# Patient Record
Sex: Female | Born: 1950 | Hispanic: Yes | Smoking: Never smoker
Health system: Southern US, Community
[De-identification: ages and names within clinical notes are randomized; demographics above are authoritative.]

## PROBLEM LIST (undated history)

## (undated) DIAGNOSIS — I10 Essential (primary) hypertension: Secondary | ICD-10-CM

## (undated) DIAGNOSIS — M797 Fibromyalgia: Secondary | ICD-10-CM

## (undated) DIAGNOSIS — E119 Type 2 diabetes mellitus without complications: Secondary | ICD-10-CM

## (undated) HISTORY — PX: OTHER SURGICAL HISTORY: SHX169

## (undated) HISTORY — PX: APPENDECTOMY: SHX54

## (undated) HISTORY — PX: BREAST REDUCTION SURGERY: SHX8

---

## 1997-09-21 DIAGNOSIS — E119 Type 2 diabetes mellitus without complications: Secondary | ICD-10-CM

## 2002-09-21 HISTORY — PX: BACK SURGERY: SHX140

## 2007-10-05 ENCOUNTER — Encounter: Payer: Self-pay | Admitting: Family Medicine

## 2008-03-19 ENCOUNTER — Ambulatory Visit: Payer: Self-pay | Admitting: Family Medicine

## 2008-03-19 DIAGNOSIS — Z862 Personal history of diseases of the blood and blood-forming organs and certain disorders involving the immune mechanism: Secondary | ICD-10-CM

## 2008-03-19 DIAGNOSIS — E669 Obesity, unspecified: Secondary | ICD-10-CM

## 2008-03-19 DIAGNOSIS — Z8639 Personal history of other endocrine, nutritional and metabolic disease: Secondary | ICD-10-CM

## 2008-03-19 DIAGNOSIS — R12 Heartburn: Secondary | ICD-10-CM

## 2008-03-19 DIAGNOSIS — I10 Essential (primary) hypertension: Secondary | ICD-10-CM

## 2008-04-17 ENCOUNTER — Telehealth: Payer: Self-pay | Admitting: Family Medicine

## 2008-04-24 ENCOUNTER — Encounter: Payer: Self-pay | Admitting: Family Medicine

## 2008-04-26 ENCOUNTER — Encounter: Payer: Self-pay | Admitting: Family Medicine

## 2008-09-10 ENCOUNTER — Encounter: Payer: Self-pay | Admitting: Family Medicine

## 2008-09-19 ENCOUNTER — Telehealth (INDEPENDENT_AMBULATORY_CARE_PROVIDER_SITE_OTHER): Payer: Self-pay | Admitting: *Deleted

## 2008-09-24 ENCOUNTER — Ambulatory Visit: Payer: Self-pay | Admitting: Family Medicine

## 2008-09-24 ENCOUNTER — Telehealth: Payer: Self-pay | Admitting: Family Medicine

## 2008-09-24 DIAGNOSIS — R519 Headache, unspecified: Secondary | ICD-10-CM | POA: Insufficient documentation

## 2008-09-24 DIAGNOSIS — M25569 Pain in unspecified knee: Secondary | ICD-10-CM

## 2008-09-24 DIAGNOSIS — R42 Dizziness and giddiness: Secondary | ICD-10-CM

## 2008-09-24 DIAGNOSIS — M25519 Pain in unspecified shoulder: Secondary | ICD-10-CM

## 2008-09-24 DIAGNOSIS — I7300 Raynaud's syndrome without gangrene: Secondary | ICD-10-CM

## 2008-09-24 DIAGNOSIS — K117 Disturbances of salivary secretion: Secondary | ICD-10-CM | POA: Insufficient documentation

## 2008-09-24 DIAGNOSIS — H5710 Ocular pain, unspecified eye: Secondary | ICD-10-CM

## 2008-09-24 DIAGNOSIS — R51 Headache: Secondary | ICD-10-CM

## 2008-09-24 LAB — CONVERTED CEMR LAB
Alkaline Phosphatase: 99 units/L (ref 39–117)
BUN: 17 mg/dL (ref 6–23)
Blood in Urine, dipstick: NEGATIVE
CO2: 20 meq/L (ref 19–32)
Direct LDL: 164 mg/dL — ABNORMAL HIGH
Glucose, Bld: 90 mg/dL (ref 70–99)
HCT: 44.5 % (ref 36.0–46.0)
Hemoglobin: 15.2 g/dL — ABNORMAL HIGH (ref 12.0–15.0)
Hgb A1c MFr Bld: 6.6 %
LDL Cholesterol: 164 mg/dL
MCHC: 34.2 g/dL (ref 30.0–36.0)
MCV: 90.8 fL (ref 78.0–100.0)
Nitrite: NEGATIVE
Protein, U semiquant: NEGATIVE
RBC: 4.9 M/uL (ref 3.87–5.11)
Total Bilirubin: 0.5 mg/dL (ref 0.3–1.2)
Urobilinogen, UA: 2
WBC Urine, dipstick: NEGATIVE

## 2008-09-25 ENCOUNTER — Encounter: Payer: Self-pay | Admitting: Family Medicine

## 2008-10-08 ENCOUNTER — Telehealth: Payer: Self-pay | Admitting: Family Medicine

## 2008-10-09 ENCOUNTER — Encounter: Payer: Self-pay | Admitting: Family Medicine

## 2008-10-15 ENCOUNTER — Ambulatory Visit: Payer: Self-pay | Admitting: Family Medicine

## 2008-10-15 ENCOUNTER — Encounter: Payer: Self-pay | Admitting: Family Medicine

## 2008-10-15 DIAGNOSIS — M545 Low back pain: Secondary | ICD-10-CM

## 2008-10-17 ENCOUNTER — Encounter: Payer: Self-pay | Admitting: Family Medicine

## 2008-12-24 ENCOUNTER — Encounter: Payer: Self-pay | Admitting: *Deleted

## 2009-10-20 ENCOUNTER — Emergency Department (HOSPITAL_COMMUNITY): Admission: EM | Admit: 2009-10-20 | Discharge: 2009-10-21 | Payer: Self-pay | Admitting: Emergency Medicine

## 2010-02-26 ENCOUNTER — Emergency Department (HOSPITAL_BASED_OUTPATIENT_CLINIC_OR_DEPARTMENT_OTHER): Admission: EM | Admit: 2010-02-26 | Discharge: 2010-02-27 | Payer: Self-pay | Admitting: Emergency Medicine

## 2010-02-27 ENCOUNTER — Telehealth: Payer: Self-pay | Admitting: Family Medicine

## 2010-02-27 ENCOUNTER — Encounter: Payer: Self-pay | Admitting: Family Medicine

## 2010-09-10 ENCOUNTER — Inpatient Hospital Stay (HOSPITAL_COMMUNITY)
Admission: AD | Admit: 2010-09-10 | Discharge: 2010-09-11 | Payer: Self-pay | Source: Home / Self Care | Attending: Obstetrics & Gynecology | Admitting: Obstetrics & Gynecology

## 2010-10-21 NOTE — Miscellaneous (Signed)
Summary: ED for stomach pain  Clinical Lists Changes per md she went to ed. states she has pain in stomach, also has pain behind L knee. phone call was dropped . I called back & LM asking her to call so we can make an appt.Marland KitchenMarland KitchenGolden Circle RN  February 27, 2010 3:13 PM  I called back. she is leaving for [place] in the am. states she will get checked there.Golden Circle RN  February 27, 2010 4:54 PM  Noted. Her liver tests were normal when I last saw her in Jan of 2010.

## 2010-10-21 NOTE — Progress Notes (Signed)
  Phone Note Other Incoming   Caller: ED in [place] Details for Reason: Update and arrange f/u appt Summary of Call: Pt seen in [facility] 6/8 with abdominal pain. Improved with IV hydration.  EDP requests f/u at [facility] 6/9.  Sig labs are: CBC: WNL. INR 1.44, CMP Tbili 1.5, Alk Phos 125, AST 336 ALT 223 Lipase 446. See Debera Lat for further details and ED note.  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  Initial call taken by: Clementeen Graham MD,  February 27, 2010 2:20 AM

## 2010-10-26 ENCOUNTER — Encounter: Payer: Self-pay | Admitting: *Deleted

## 2010-12-01 LAB — DIFFERENTIAL
Basophils Absolute: 0 10*3/uL (ref 0.0–0.1)
Basophils Relative: 0 % (ref 0–1)
Eosinophils Relative: 3 % (ref 0–5)
Lymphocytes Relative: 30 % (ref 12–46)
Monocytes Absolute: 0.9 10*3/uL (ref 0.1–1.0)
Neutro Abs: 4.1 10*3/uL (ref 1.7–7.7)

## 2010-12-01 LAB — GC/CHLAMYDIA PROBE AMP, GENITAL
Chlamydia, DNA Probe: NEGATIVE
GC Probe Amp, Genital: NEGATIVE

## 2010-12-01 LAB — CBC
HCT: 40 % (ref 36.0–46.0)
MCHC: 35.5 g/dL (ref 30.0–36.0)
Platelets: 212 10*3/uL (ref 150–400)
RDW: 12.8 % (ref 11.5–15.5)
WBC: 7.4 10*3/uL (ref 4.0–10.5)

## 2010-12-01 LAB — URINE MICROSCOPIC-ADD ON

## 2010-12-01 LAB — URINALYSIS, ROUTINE W REFLEX MICROSCOPIC
Glucose, UA: NEGATIVE mg/dL
Hgb urine dipstick: NEGATIVE
Protein, ur: NEGATIVE mg/dL
Specific Gravity, Urine: 1.025 (ref 1.005–1.030)
pH: 5.5 (ref 5.0–8.0)

## 2010-12-01 LAB — BASIC METABOLIC PANEL
BUN: 11 mg/dL (ref 6–23)
Creatinine, Ser: 0.7 mg/dL (ref 0.4–1.2)
GFR calc non Af Amer: >60 mL/min (ref >60)
Glucose, Bld: 147 mg/dL — ABNORMAL HIGH (ref 70–99)
Potassium: 3.9 mEq/L (ref 3.5–5.1)

## 2010-12-01 LAB — WET PREP, GENITAL
Clue Cells Wet Prep HPF POC: NONE SEEN
Trich, Wet Prep: NONE SEEN
Yeast Wet Prep HPF POC: NONE SEEN

## 2010-12-01 LAB — URINE CULTURE: Colony Count: NO GROWTH

## 2010-12-08 LAB — POCT I-STAT, CHEM 8
BUN: 17 mg/dL (ref 6–23)
BUN: 17 mg/dL (ref 6–23)
Calcium, Ion: 1.18 mmol/L (ref 1.12–1.32)
Calcium, Ion: 1.2 mmol/L (ref 1.12–1.32)
Chloride: 107 mEq/L (ref 96–112)
Chloride: 107 mEq/L (ref 96–112)
Creatinine, Ser: 0.8 mg/dL (ref 0.4–1.2)
Creatinine, Ser: 0.8 mg/dL (ref 0.4–1.2)
Glucose, Bld: 163 mg/dL — ABNORMAL HIGH (ref 70–99)
Glucose, Bld: 164 mg/dL — ABNORMAL HIGH (ref 70–99)
Potassium: 4.3 mEq/L (ref 3.5–5.1)

## 2010-12-08 LAB — URINALYSIS, ROUTINE W REFLEX MICROSCOPIC
Bilirubin Urine: NEGATIVE
Ketones, ur: NEGATIVE mg/dL
Nitrite: NEGATIVE
Urobilinogen, UA: 0.2 mg/dL (ref 0.0–1.0)

## 2010-12-08 LAB — LIPASE, BLOOD: Lipase: 446 U/L — ABNORMAL HIGH (ref 23–300)

## 2010-12-08 LAB — COMPREHENSIVE METABOLIC PANEL
Albumin: 4.1 g/dL (ref 3.5–5.2)
BUN: 16 mg/dL (ref 6–23)
Calcium: 9.3 mg/dL (ref 8.4–10.5)
Chloride: 105 mEq/L (ref 96–112)
Creatinine, Ser: 0.8 mg/dL (ref 0.4–1.2)
Total Bilirubin: 1.5 mg/dL — ABNORMAL HIGH (ref 0.3–1.2)

## 2010-12-08 LAB — CBC
HCT: 39 % (ref 36.0–46.0)
MCHC: 35 g/dL (ref 30.0–36.0)
MCV: 90.9 fL (ref 78.0–100.0)
Platelets: 203 10*3/uL (ref 150–400)
RDW: 12.5 % (ref 11.5–15.5)
WBC: 9.5 10*3/uL (ref 4.0–10.5)

## 2010-12-08 LAB — DIFFERENTIAL
Basophils Absolute: 0 10*3/uL (ref 0.0–0.1)
Lymphocytes Relative: 18 % (ref 12–46)
Monocytes Absolute: 0.8 10*3/uL (ref 0.1–1.0)
Neutro Abs: 6.9 10*3/uL (ref 1.7–7.7)

## 2010-12-08 LAB — PROTIME-INR: INR: 1.44 (ref 0.00–1.49)

## 2013-12-08 ENCOUNTER — Emergency Department (HOSPITAL_COMMUNITY): Payer: Medicare Other

## 2013-12-08 ENCOUNTER — Emergency Department (HOSPITAL_COMMUNITY)
Admission: EM | Admit: 2013-12-08 | Discharge: 2013-12-08 | Disposition: A | Discharge status: Home / Self Care | Payer: Medicare Other | Attending: Emergency Medicine | Admitting: Emergency Medicine

## 2013-12-08 ENCOUNTER — Encounter (HOSPITAL_COMMUNITY): Payer: Self-pay | Admitting: Emergency Medicine

## 2013-12-08 DIAGNOSIS — N949 Unspecified condition associated with female genital organs and menstrual cycle: Secondary | ICD-10-CM | POA: Diagnosis not present

## 2013-12-08 DIAGNOSIS — Z7901 Long term (current) use of anticoagulants: Secondary | ICD-10-CM | POA: Insufficient documentation

## 2013-12-08 DIAGNOSIS — Z792 Long term (current) use of antibiotics: Secondary | ICD-10-CM | POA: Insufficient documentation

## 2013-12-08 DIAGNOSIS — Z9889 Other specified postprocedural states: Secondary | ICD-10-CM | POA: Diagnosis not present

## 2013-12-08 DIAGNOSIS — M79609 Pain in unspecified limb: Secondary | ICD-10-CM | POA: Insufficient documentation

## 2013-12-08 DIAGNOSIS — M545 Low back pain, unspecified: Secondary | ICD-10-CM | POA: Insufficient documentation

## 2013-12-08 DIAGNOSIS — I10 Essential (primary) hypertension: Secondary | ICD-10-CM | POA: Diagnosis not present

## 2013-12-08 DIAGNOSIS — E119 Type 2 diabetes mellitus without complications: Secondary | ICD-10-CM | POA: Diagnosis not present

## 2013-12-08 DIAGNOSIS — Z87442 Personal history of urinary calculi: Secondary | ICD-10-CM | POA: Diagnosis not present

## 2013-12-08 DIAGNOSIS — M549 Dorsalgia, unspecified: Secondary | ICD-10-CM | POA: Diagnosis present

## 2013-12-08 DIAGNOSIS — Z79899 Other long term (current) drug therapy: Secondary | ICD-10-CM | POA: Diagnosis not present

## 2013-12-08 DIAGNOSIS — R109 Unspecified abdominal pain: Secondary | ICD-10-CM

## 2013-12-08 DIAGNOSIS — R1032 Left lower quadrant pain: Secondary | ICD-10-CM | POA: Insufficient documentation

## 2013-12-08 HISTORY — DX: Essential (primary) hypertension: I10

## 2013-12-08 HISTORY — DX: Type 2 diabetes mellitus without complications: E11.9

## 2013-12-08 HISTORY — DX: Fibromyalgia: M79.7

## 2013-12-08 LAB — BASIC METABOLIC PANEL
BUN: 16 mg/dL (ref 6–23)
CHLORIDE: 92 meq/L — AB (ref 96–112)
CO2: 26 meq/L (ref 19–32)
CREATININE: 0.8 mg/dL (ref 0.50–1.10)
Calcium: 10 mg/dL (ref 8.4–10.5)
GFR calc non Af Amer: 77 mL/min — ABNORMAL LOW (ref >90)
GFR, EST AFRICAN AMERICAN: 90 mL/min — AB (ref >90)
Glucose, Bld: 353 mg/dL — ABNORMAL HIGH (ref 70–99)
POTASSIUM: 4.1 meq/L (ref 3.7–5.3)
SODIUM: 133 meq/L — AB (ref 137–147)

## 2013-12-08 LAB — URINALYSIS, ROUTINE W REFLEX MICROSCOPIC
Bilirubin Urine: NEGATIVE
Glucose, UA: >1000 mg/dL — AB
Hgb urine dipstick: NEGATIVE
Ketones, ur: NEGATIVE mg/dL
LEUKOCYTES UA: NEGATIVE
NITRITE: NEGATIVE
PH: 6.5 (ref 5.0–8.0)
Protein, ur: NEGATIVE mg/dL
SPECIFIC GRAVITY, URINE: 1.018 (ref 1.005–1.030)
Urobilinogen, UA: 1 mg/dL (ref 0.0–1.0)

## 2013-12-08 LAB — WET PREP, GENITAL
Clue Cells Wet Prep HPF POC: NONE SEEN
TRICH WET PREP: NONE SEEN
Yeast Wet Prep HPF POC: NONE SEEN

## 2013-12-08 LAB — CBC WITH DIFFERENTIAL/PLATELET
BASOS ABS: 0.1 10*3/uL (ref 0.0–0.1)
Basophils Relative: 1 % (ref 0–1)
Eosinophils Absolute: 0.2 10*3/uL (ref 0.0–0.7)
Eosinophils Relative: 3 % (ref 0–5)
HEMATOCRIT: 41.3 % (ref 36.0–46.0)
HEMOGLOBIN: 14.3 g/dL (ref 12.0–15.0)
LYMPHS PCT: 35 % (ref 12–46)
Lymphs Abs: 2.8 10*3/uL (ref 0.7–4.0)
MCH: 29.9 pg (ref 26.0–34.0)
MCHC: 34.6 g/dL (ref 30.0–36.0)
MCV: 86.2 fL (ref 78.0–100.0)
MONO ABS: 0.8 10*3/uL (ref 0.1–1.0)
Monocytes Relative: 11 % (ref 3–12)
NEUTROS PCT: 51 % (ref 43–77)
Neutro Abs: 4.1 10*3/uL (ref 1.7–7.7)
Platelets: 210 10*3/uL (ref 150–400)
RBC: 4.79 MIL/uL (ref 3.87–5.11)
RDW: 13.4 % (ref 11.5–15.5)
WBC: 8 10*3/uL (ref 4.0–10.5)

## 2013-12-08 LAB — URINE MICROSCOPIC-ADD ON

## 2013-12-08 MED ORDER — PREDNISONE 20 MG PO TABS: ORAL_TABLET | ORAL | Status: DC

## 2013-12-08 MED ORDER — ONDANSETRON HCL 4 MG/2ML IJ SOLN
4.0000 mg | Freq: Once | INTRAMUSCULAR | Status: AC
  Administered 2013-12-08: 4 mg via INTRAVENOUS
  Filled 2013-12-08: qty 2

## 2013-12-08 MED ORDER — OXYCODONE-ACETAMINOPHEN 5-325 MG PO TABS: 1.0000 | ORAL_TABLET | Freq: Four times a day (QID) | ORAL | Status: DC | PRN

## 2013-12-08 MED ORDER — OXYCODONE-ACETAMINOPHEN 5-325 MG PO TABS
2.0000 | ORAL_TABLET | Freq: Once | ORAL | Status: AC
  Administered 2013-12-08: 2 via ORAL
  Filled 2013-12-08: qty 2

## 2013-12-08 MED ORDER — HYDROMORPHONE HCL PF 1 MG/ML IJ SOLN
1.0000 mg | Freq: Once | INTRAMUSCULAR | Status: AC
  Administered 2013-12-08: 1 mg via INTRAVENOUS
  Filled 2013-12-08: qty 1

## 2013-12-08 MED ORDER — OXYCODONE-ACETAMINOPHEN 5-325 MG PO TABS
1.0000 | ORAL_TABLET | Freq: Four times a day (QID) | ORAL | Status: AC | PRN
Start: 2013-12-08 — End: ?

## 2013-12-08 NOTE — ED Provider Notes (Signed)
CSN: [id number]     Arrival date & time 12/08/13  1559 History   First MD Initiated Contact with Patient 12/08/13 1650     Chief Complaint  Patient presents with  . Back Pain   (Consider location/radiation/quality/duration/timing/severity/associated sxs/prior Treatment) HPI Comments: Patient with history of renal stones, back surgery -- presents with complaint of persistent left lower back, left pelvis, and left leg pain for the past 4 days. Patient was seen at [facility] ED 3 days ago. She had a negative noncontrast CT showing only nonobstructive renal stones. Patient followed up with urologist the following day who felt stones were not likely etiology of pain. Patient has been taking Keflex, Vicodin. Pain continues to be severe. It radiates into her leg. Patient denies urinary retention, fecal incontinence. No fevers. No nausea or vomiting. She is ambulatory and denies weakness in her leg. No vaginal bleeding or discharge. The onset of this condition was acute. The course is constant. Aggravating factors: palpation and movement. Alleviating factors: none.    Patient is a 63 y.o. female presenting with back pain. The history is provided by the patient and medical records.  Back Pain Associated symptoms: abdominal pain   Associated symptoms: no chest pain, no dysuria, no fever, no headaches, no numbness, no pelvic pain and no weakness     Past Medical History  Diagnosis Date  . Hypertension   . Diabetes mellitus without complication   . Fibromyalgia    Past Surgical History  Procedure Laterality Date  . Appendectomy    . Tummy tuck    . Breast reduction surgery    . Back surgery  2004    herniated disc   No family history on file. History  Substance Use Topics  . Smoking status: Never Smoker   . Smokeless tobacco: Not on file  . Alcohol Use: No   OB History   Grav Para Term Preterm Abortions TAB SAB Ect Mult Living                 Review of Systems  Constitutional: Negative  for fever and unexpected weight change.  HENT: Negative for rhinorrhea and sore throat.   Eyes: Negative for redness.  Respiratory: Negative for cough.   Cardiovascular: Negative for chest pain and leg swelling.  Gastrointestinal: Positive for abdominal pain. Negative for nausea, vomiting, diarrhea and constipation.       Negative for fecal incontinence.   Genitourinary: Negative for dysuria, hematuria, flank pain, vaginal bleeding, vaginal discharge and pelvic pain.       Negative for urinary incontinence or retention.  Musculoskeletal: Positive for back pain. Negative for myalgias.  Skin: Negative for rash.  Neurological: Negative for weakness, numbness and headaches.       Denies saddle paresthesias.      Allergies  Contrast media and Hydrocodone  Home Medications   Current Outpatient Rx  Name  Route  Sig  Dispense  Refill  . cephALEXin (KEFLEX) 500 MG capsule   Oral   Take 500 mg by mouth 4 (four) times daily.         Marland Kitchen diltiazem (DILACOR XR) 240 MG 24 hr capsule   Oral   Take 240 mg by mouth daily.         Marland Kitchen gabapentin (NEURONTIN) 100 MG capsule   Oral   Take 100 mg by mouth 3 (three) times daily.         Marland Kitchen glipiZIDE (GLUCOTROL) 10 MG tablet   Oral   Take 10  mg by mouth 2 (two) times daily before a meal.         . hydrochlorothiazide (MICROZIDE) 12.5 MG capsule   Oral   Take 12.5 mg by mouth daily.         Marland Kitchen HYDROcodone-acetaminophen (NORCO/VICODIN) 5-325 MG per tablet   Oral   Take 1-2 tablets by mouth every 4 (four) hours as needed for moderate pain.         . hyoscyamine (LEVSIN, ANASPAZ) 0.125 MG tablet   Oral   Take 0.125 mg by mouth every 6 (six) hours as needed for cramping.         . Linaclotide (LINZESS) 145 MCG CAPS capsule   Oral   Take 145 mcg by mouth daily.         Marland Kitchen lisinopril (PRINIVIL,ZESTRIL) 20 MG tablet   Oral   Take 20 mg by mouth daily.           . metFORMIN (GLUCOPHAGE) 500 MG tablet   Oral   Take 500 mg by  mouth daily.           . metoCLOPramide (REGLAN) 5 MG tablet   Oral   Take 5 mg by mouth 3 (three) times daily as needed for nausea.         . metoprolol (LOPRESSOR) 50 MG tablet   Oral   Take 50 mg by mouth daily.         . naproxen sodium (ANAPROX) 220 MG tablet   Oral   Take 220 mg by mouth 2 (two) times daily as needed (pain).         Marland Kitchen omeprazole (PRILOSEC) 40 MG capsule   Oral   Take 40 mg by mouth daily.         Marland Kitchen Propylene Glycol (SYSTANE BALANCE) 0.6 % SOLN   Ophthalmic   Apply 1 drop to eye as needed (dry eyes).         . vitamin B-12 (CYANOCOBALAMIN) 1000 MCG tablet   Oral   Take 6,000 mcg by mouth daily.         Marland Kitchen warfarin (COUMADIN) 5 MG tablet   Oral   Take 2.5-5 mg by mouth See admin instructions. Take 1/2 tab (2.5mg ) every day except on Wednesdays take 1 tablet (5mg ).          BP 125/81  Pulse 59  Temp(Src) 98.4 F (36.9 C) (Oral)  Resp 18  SpO2 100%  Physical Exam  Nursing note and vitals reviewed. Constitutional: She appears well-developed and well-nourished.  HENT:  Head: Normocephalic and atraumatic.  Eyes: Conjunctivae are normal. Right eye exhibits no discharge. Left eye exhibits no discharge.  Neck: Normal range of motion. Neck supple.  Cardiovascular: Normal rate, regular rhythm and normal heart sounds.   Pulmonary/Chest: Effort normal and breath sounds normal.  Abdominal: Soft. Bowel sounds are normal. There is tenderness in the left lower quadrant. There is no rebound, no guarding and no CVA tenderness.    Genitourinary: Uterus is not enlarged and not tender. Cervix exhibits no motion tenderness and no discharge. Right adnexum displays no mass, no tenderness and no fullness. Left adnexum displays tenderness. Left adnexum displays no mass and no fullness. No vaginal discharge found.  Musculoskeletal:       Right hip: Normal. She exhibits normal range of motion, normal strength and no tenderness.       Left hip: Normal. She  exhibits normal range of motion, normal strength and no tenderness.  Lumbar back: She exhibits decreased range of motion and tenderness. She exhibits no bony tenderness.       Back:       Legs: Neurological: She is alert.  Skin: Skin is warm and dry.  Psychiatric: She has a normal mood and affect.    ED Course  Procedures (including critical care time) Labs Review Labs Reviewed  WET PREP, GENITAL - Abnormal; Notable for the following:    WBC, Wet Prep HPF POC FEW (*)    All other components within normal limits  BASIC METABOLIC PANEL - Abnormal; Notable for the following:    Sodium 133 (*)    Chloride 92 (*)    Glucose, Bld 353 (*)    GFR calc non Af Amer 77 (*)    GFR calc Af Amer 90 (*)    All other components within normal limits  URINALYSIS, ROUTINE W REFLEX MICROSCOPIC - Abnormal; Notable for the following:    Glucose, UA >1000 (*)    All other components within normal limits  URINE MICROSCOPIC-ADD ON - Abnormal; Notable for the following:    Squamous Epithelial / LPF MANY (*)    All other components within normal limits  GC/CHLAMYDIA PROBE AMP  CBC WITH DIFFERENTIAL   Imaging Review US Transvaginal Non-ob  12/08/2013   CLINICAL DATA:  Left pelvic pain.  Postmenopausal female.  EXAM: TRANSABDOMINAL AND TRANSVAGINAL ULTRASOUND OF PELVIS  TECHNIQUE: Both transabdominal and transvaginal ultrasound examinations of the pelvis were performed. Transabdominal technique was performed for global imaging of the pelvis including uterus, ovaries, adnexal regions, and pelvic cul-de-sac. It was necessary to proceed with endovaginal exam following the transabdominal exam to visualize the endometrium and ovaries.  COMPARISON:  None  FINDINGS: Uterus  Measurements: 7.3 x 3.7 x 4.4 cm. No fibroids or other mass visualized.  Endometrium  Thickness: 5 mm.  No focal abnormality visualized.  Right ovary  Measurements: Not directly visualized by transabdominal or transvaginal sonography, however  no adnexal mass identified.  Left ovary  Measurements: Not directly visualized by transabdominal or transvaginal sonography, however no adnexal mass identified.  Other findings  No free fluid. Patient could not completely empty her bladder or before transvaginal scanning, which limits evaluation.  IMPRESSION: Normal appearance of uterus.  Nonvisualization of ovaries, however no adnexal mass identified.   Electronically Signed   By: Myles Rosenthal M.D.   On: 12/08/2013 19:59   US Pelvis Complete  12/08/2013   CLINICAL DATA:  Left pelvic pain.  Postmenopausal female.  EXAM: TRANSABDOMINAL AND TRANSVAGINAL ULTRASOUND OF PELVIS  TECHNIQUE: Both transabdominal and transvaginal ultrasound examinations of the pelvis were performed. Transabdominal technique was performed for global imaging of the pelvis including uterus, ovaries, adnexal regions, and pelvic cul-de-sac. It was necessary to proceed with endovaginal exam following the transabdominal exam to visualize the endometrium and ovaries.  COMPARISON:  None  FINDINGS: Uterus  Measurements: 7.3 x 3.7 x 4.4 cm. No fibroids or other mass visualized.  Endometrium  Thickness: 5 mm.  No focal abnormality visualized.  Right ovary  Measurements: Not directly visualized by transabdominal or transvaginal sonography, however no adnexal mass identified.  Left ovary  Measurements: Not directly visualized by transabdominal or transvaginal sonography, however no adnexal mass identified.  Other findings  No free fluid. Patient could not completely empty her bladder or before transvaginal scanning, which limits evaluation.  IMPRESSION: Normal appearance of uterus.  Nonvisualization of ovaries, however no adnexal mass identified.   Electronically Signed   By: Jonny Ruiz  Eppie GibsonStahl M.D.   On: 12/08/2013 19:59     EKG Interpretation None      5:51 PM Patient seen and examined. Work-up initiated. Medications ordered. Records from recent ED and urologist visits reviewed.   Vital signs  reviewed and are as follows: Filed Vitals:   12/08/13 1629  BP: 125/81  Pulse: 59  Temp: 98.4 F (36.9 C)  Resp: 18   Pelvic performed with tech chaperone Kendal Hymen(Bonnie). US ordered, pending. Pain is controlled, patient is sleepy.   8:15 PM Pain still controlled. D/w Dr. Effie ShyWentz who will see.   Patient feeling better. She is okay to go home. Feel this is likely MSK pain. Patient informed of results.   She is encouraged to f/u with PCP.   Patient counseled on use of narcotic pain medications. Counseled not to combine these medications with others containing tylenol. Urged not to drink alcohol, drive, or perform any other activities that requires focus while taking these medications. The patient verbalizes understanding and agrees with the plan.  MDM   Final diagnoses:  Abdominal pain  Back pain   Patient with abdominal, back pain. Recent CT reviewed on care everywhere. No ureteral stones. Labs reassuring, she does have hyperglycemia without ketosis. US without abnormality.   Back and leg pain may represent lumbar radiculopathy. She will need outpatient work-up. No red flag s/s of low back pain.   No dangerous or life-threatening conditions suspected or identified by history, physical exam, and by work-up. No indications for hospitalization identified.      Renne CriglerJoshua Kourtney Terriquez, PA-C 12/08/13 2321

## 2013-12-08 NOTE — ED Notes (Signed)
Pt reports being seen at OSH with flank and back pain, Dx with kidney stone and sent home. Having continued pain. Given vicodin, and has been taking it without relief.

## 2013-12-08 NOTE — ED Provider Notes (Signed)
  Face-to-face evaluation   History: She complains of left low back pain radiating to the entire left leg for one week. No recent trauma. History remote lumbar herniated disc, treated with surgery. No persistent, urinary or bowel incontinence. No saddle anesthesia.  Physical exam: Alert, calm, cooperative. Mild pain left lumbar to palpation. She demonstrated normal ability to walk in the emergency department.   Medical screening examination/treatment/procedure(s) were conducted as a shared visit with non-physician practitioner(s) and myself.  I personally evaluated the patient during the encounter  Flint MelterElliott L Sharlisa Hollifield, MD 12/08/13 [phone]

## 2013-12-08 NOTE — Discharge Instructions (Signed)
Please read and follow all provided instructions.  Your diagnoses today include:  1. Abdominal pain   2. Back pain     Tests performed today include:  Blood counts and electrolytes - high blood sugar  Blood tests to check liver and kidney function  Blood tests to check pancreas function  Urine test to look for infection  Ultrasound - shows no problems in pelvis  Vital signs. See below for your results today.   Medications prescribed:   Percocet (oxycodone/acetaminophen) - narcotic pain medication  DO NOT drive or perform any activities that require you to be awake and alert because this medicine can make you drowsy. BE VERY CAREFUL not to take multiple medicines containing Tylenol (also called acetaminophen). Doing so can lead to an overdose which can damage your liver and cause liver failure and possibly death.  Take any prescribed medications only as directed.  Home care instructions:   Follow any educational materials contained in this packet.  Follow-up instructions: Please follow-up with your primary care provider in the next 3 days for further evaluation of your symptoms. If you do not have a primary care doctor -- see below for referral information.   Return instructions:  SEEK IMMEDIATE MEDICAL ATTENTION IF:  The pain does not go away or becomes severe   A temperature above 101F develops   Repeated vomiting occurs (multiple episodes)   The pain becomes localized to portions of the abdomen. The right side could possibly be appendicitis. In an adult, the left lower portion of the abdomen could be colitis or diverticulitis.   Blood is being passed in stools or vomit (bright red or black tarry stools)   You develop chest pain, difficulty breathing, dizziness or fainting, or become confused, poorly responsive, or inconsolable (young children)  If you have any other emergent concerns regarding your health  Additional Information: Abdominal (belly) pain can be  caused by many things. Your caregiver performed an examination and possibly ordered blood/urine tests and imaging (CT scan, x-rays, ultrasound). Many cases can be observed and treated at home after initial evaluation in the emergency department. Even though you are being discharged home, abdominal pain can be unpredictable. Therefore, you need a repeated exam if your pain does not resolve, returns, or worsens. Most patients with abdominal pain don't have to be admitted to the hospital or have surgery, but serious problems like appendicitis and gallbladder attacks can start out as nonspecific pain. Many abdominal conditions cannot be diagnosed in one visit, so follow-up evaluations are very important.  Your vital signs today were: BP 125/81   Pulse 59   Temp(Src) 98.4 F (36.9 C) (Oral)   Resp 18   SpO2 100% If your blood pressure (bp) was elevated above 135/85 this visit, please have this repeated by your doctor within one month. --------------

## 2013-12-09 LAB — GC/CHLAMYDIA PROBE AMP
CT Probe RNA: NEGATIVE
GC Probe RNA: NEGATIVE

## 2013-12-12 NOTE — Progress Notes (Signed)
   CARE MANAGEMENT ED NOTE 12/12/2013  Patient:  Heckard,Peighton   Account Number:  [id number]  Date Initiated:  12/12/2013  Documentation initiated by:  Edd ArbourGIBBS,Taiki Buckwalter  Subjective/Objective Assessment:   63 yr old spanish female with [place] address listed, seen on 12/08/13 [facility] d/c with rx for percocet 20 total (1-2 tabs prn severe pain) *& Prednisone  Pt at pharmacy requesting more pain medications [facility]     Subjective/Objective Assessment Detail:   [facility] direct transfer from a pharmacist, Efraim Kaufmannmelissa stating the pt does not speak english  No pcp listed in EPIC     Action/Plan:   Cm spoke with Efraim Kaufmannmelissa at pharmacy while reviewing epic notes and avs Cm informed pharmacist md would not prescribe pt further meds without evaluation. CM encouraged pharmacist to have pt see her family dr/pcp for f/u services   Action/Plan Detail:   Anticipated DC Date:  12/08/2013     Status Recommendation to Physician:   Result of Recommendation:    Other ED Services  Consult Working Plan    DC Planning Services  Other  Outpatient Services - Pt will follow up  PCP issues    Choice offered to / List presented to:            Status of service:  Completed, signed off  ED Comments:   ED Comments Detail:

## 2015-09-16 IMAGING — US US PELVIS COMPLETE
1 series · 14 of 25 positions shown · non-contrast
Comparison: None

CLINICAL DATA: Left pelvic pain.  Postmenopausal female.

EXAM:
TRANSABDOMINAL AND TRANSVAGINAL ULTRASOUND OF PELVIS
TECHNIQUE: Both transabdominal and transvaginal ultrasound examinations of the
pelvis were performed. Transabdominal technique was performed for
global imaging of the pelvis including uterus, ovaries, adnexal
regions, and pelvic cul-de-sac. It was necessary to proceed with
endovaginal exam following the transabdominal exam to visualize the
endometrium and ovaries.

[Series 1: us pelvis complete · 0.27mm/px · 14 of 40 slices shown]
[im 1/40]
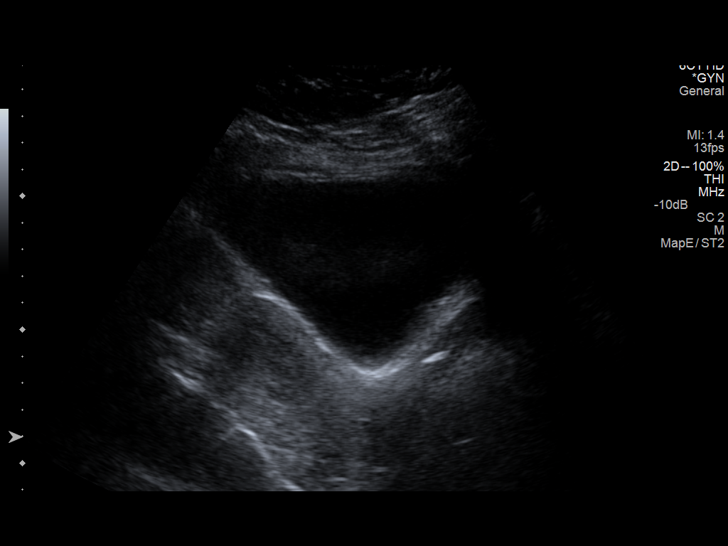
[im 4/40]
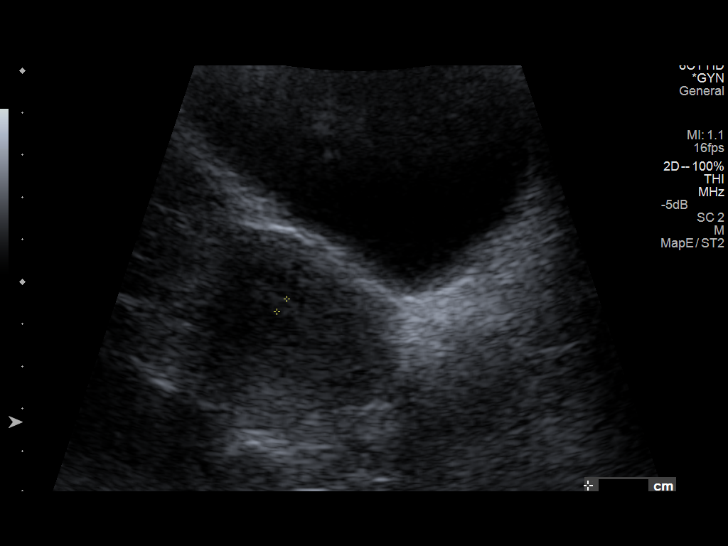
[im 7/40]
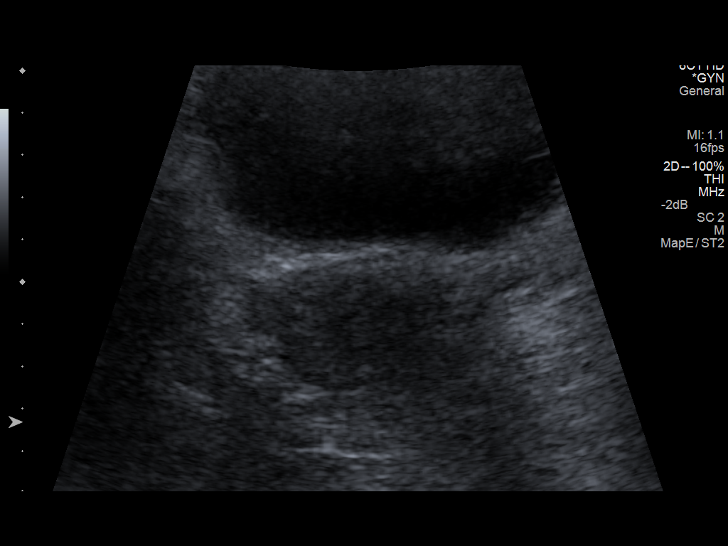
[im 10/40]
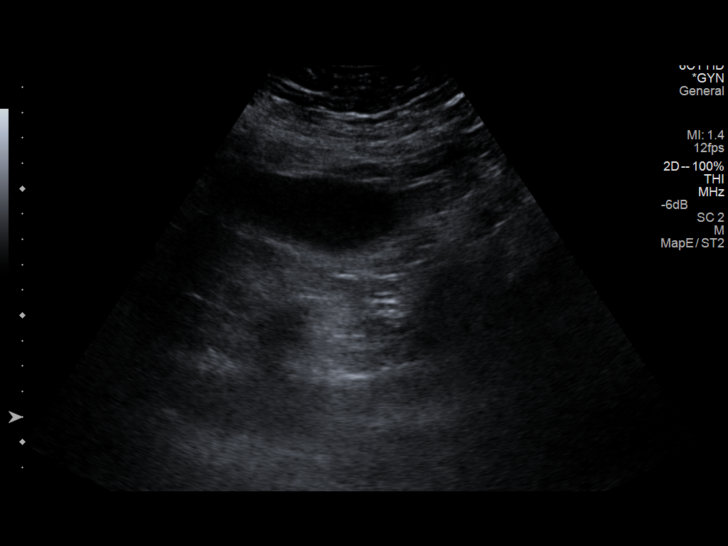
[im 14/40]
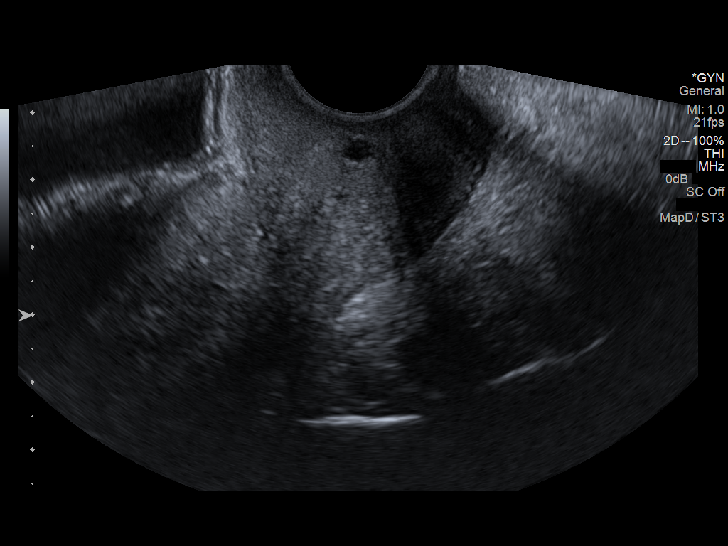
[im 15/40]
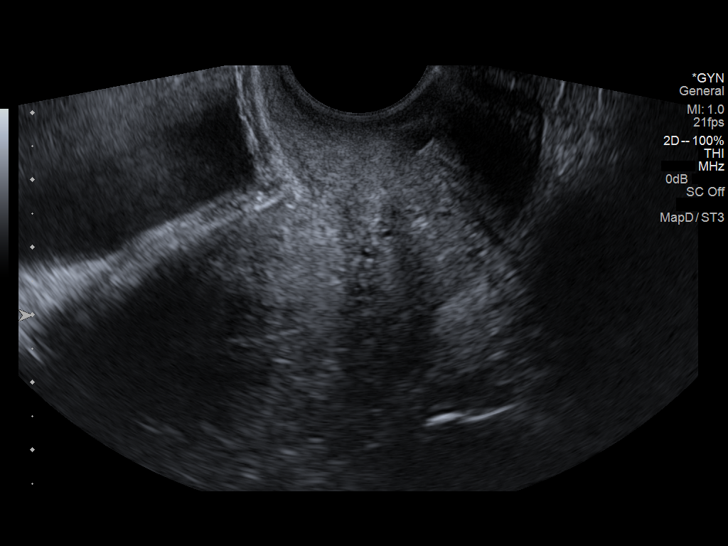
[im 18/40]
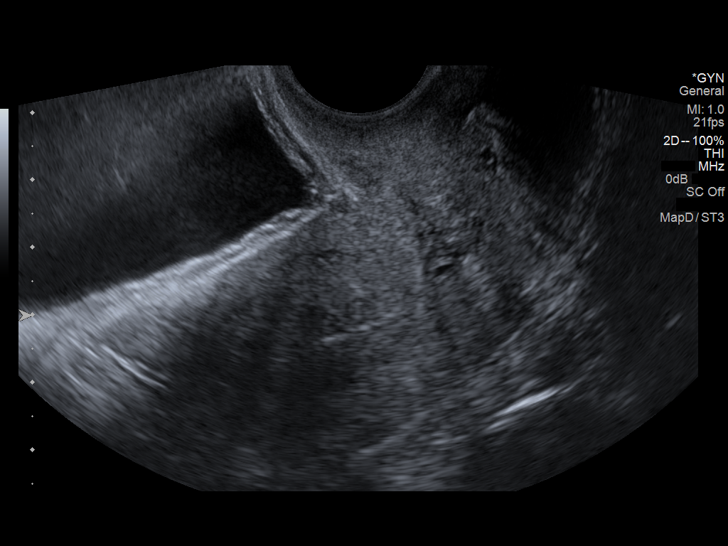
[im 22/40]
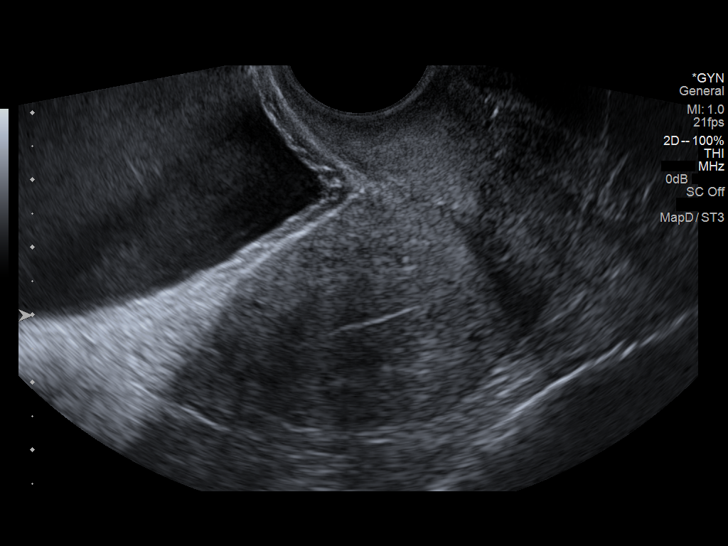
[im 25/40]
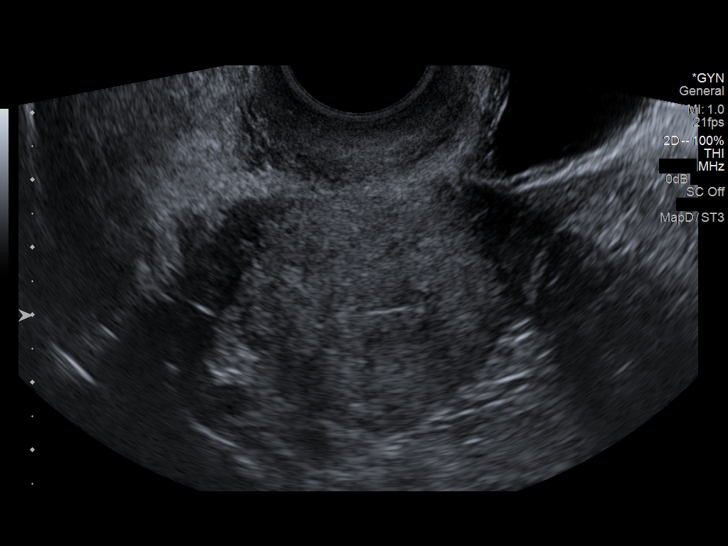
[im 27/40]
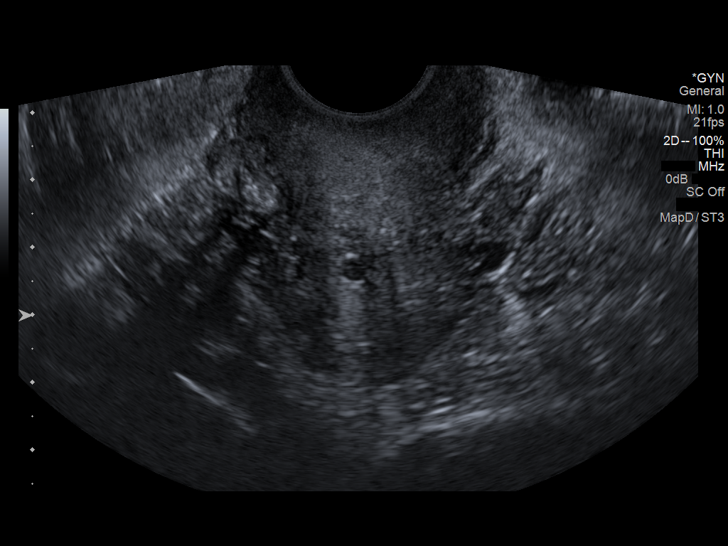
[im 30/40]
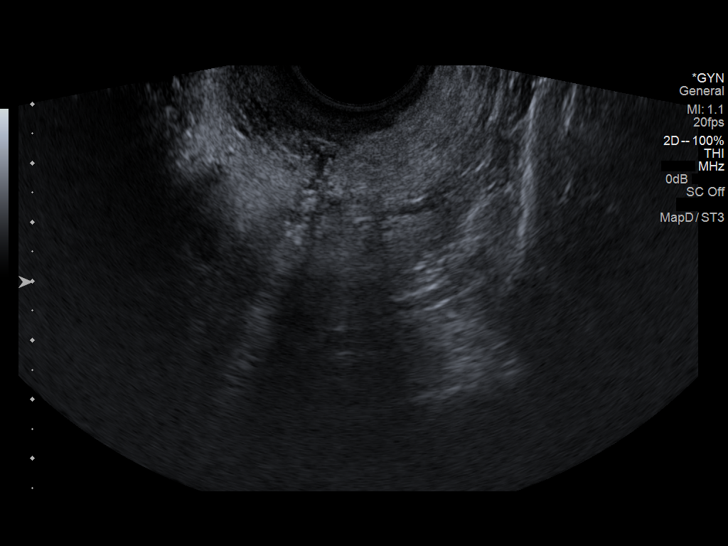
[im 33/40]
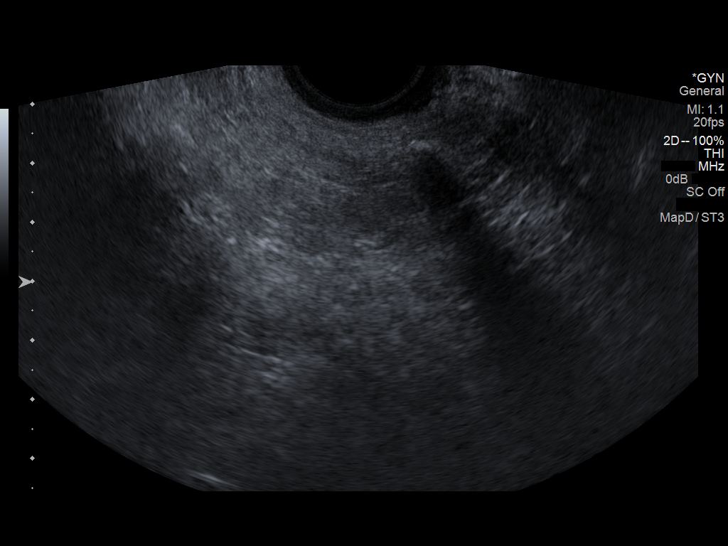
[im 36/40]
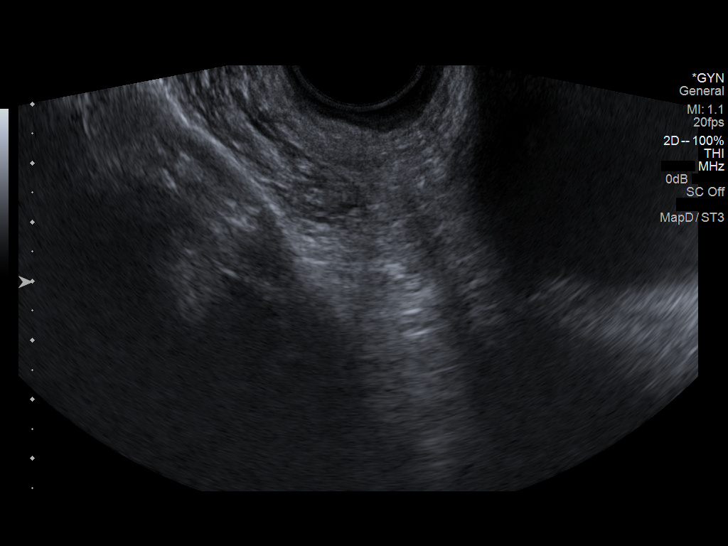
[im 40/40]
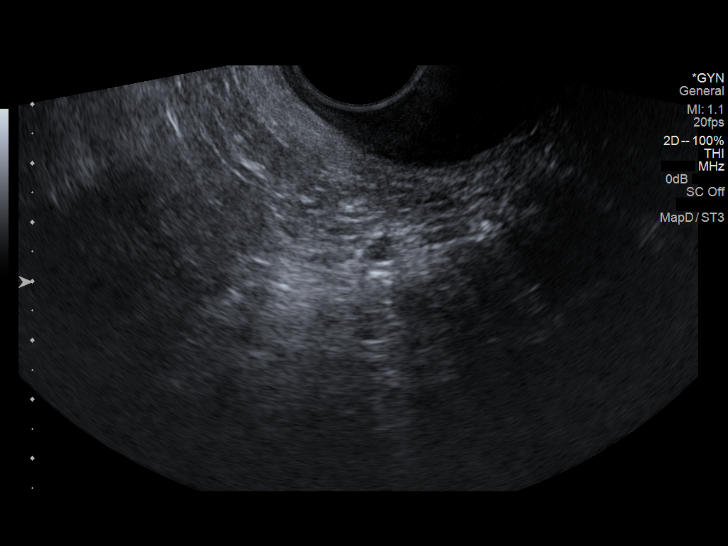

[14 of 25 positions shown; findings below may reference images not displayed]

FINDINGS: Uterus

Measurements: 7.3 x 3.7 x 4.4 cm. No fibroids or other mass
visualized.

Endometrium

Thickness: 5 mm.  No focal abnormality visualized.

Right ovary

Measurements: Not directly visualized by transabdominal or
transvaginal sonography, however no adnexal mass identified.

Left ovary

Measurements: Not directly visualized by transabdominal or
transvaginal sonography, however no adnexal mass identified.

Other findings

No free fluid. Patient could not completely empty her bladder or
before transvaginal scanning, which limits evaluation.
IMPRESSION: Normal appearance of uterus.

Nonvisualization of ovaries, however no adnexal mass identified.

## 2018-02-22 ENCOUNTER — Other Ambulatory Visit: Payer: Self-pay | Admitting: Ophthalmology

## 2018-02-22 DIAGNOSIS — H53451 Other localized visual field defect, right eye: Secondary | ICD-10-CM

## 2018-02-22 DIAGNOSIS — H4710 Unspecified papilledema: Secondary | ICD-10-CM

## 2018-03-02 ENCOUNTER — Other Ambulatory Visit: Payer: Medicare Other

## 2018-03-02 ENCOUNTER — Inpatient Hospital Stay
Admission: RE | Admit: 2018-03-02 | Discharge: 2018-03-02 | Disposition: A | Discharge status: Home / Self Care | Payer: Medicare Other | Source: Ambulatory Visit | Attending: Ophthalmology | Admitting: Ophthalmology

## 2019-05-27 ENCOUNTER — Other Ambulatory Visit: Payer: Self-pay

## 2019-05-27 ENCOUNTER — Emergency Department (HOSPITAL_COMMUNITY)
Admission: EM | Admit: 2019-05-27 | Discharge: 2019-05-28 | Disposition: A | Discharge status: Home / Self Care | Payer: Medicare Other | Attending: Emergency Medicine | Admitting: Emergency Medicine

## 2019-05-27 ENCOUNTER — Encounter (HOSPITAL_COMMUNITY): Payer: Self-pay | Admitting: Emergency Medicine

## 2019-05-27 DIAGNOSIS — R188 Other ascites: Secondary | ICD-10-CM | POA: Diagnosis not present

## 2019-05-27 DIAGNOSIS — I10 Essential (primary) hypertension: Secondary | ICD-10-CM | POA: Diagnosis not present

## 2019-05-27 DIAGNOSIS — Z79899 Other long term (current) drug therapy: Secondary | ICD-10-CM | POA: Insufficient documentation

## 2019-05-27 DIAGNOSIS — Z7984 Long term (current) use of oral hypoglycemic drugs: Secondary | ICD-10-CM | POA: Insufficient documentation

## 2019-05-27 DIAGNOSIS — Z7901 Long term (current) use of anticoagulants: Secondary | ICD-10-CM | POA: Diagnosis not present

## 2019-05-27 DIAGNOSIS — R0602 Shortness of breath: Secondary | ICD-10-CM | POA: Insufficient documentation

## 2019-05-27 DIAGNOSIS — R109 Unspecified abdominal pain: Secondary | ICD-10-CM | POA: Diagnosis present

## 2019-05-27 DIAGNOSIS — E119 Type 2 diabetes mellitus without complications: Secondary | ICD-10-CM | POA: Diagnosis not present

## 2019-05-27 NOTE — ED Triage Notes (Addendum)
Patient BIB daughter-in-law, reports abdominal pain and bloating worsening x8 days. States seen at PCP and CT scan resulted in cirrhosis recommending US guided paracentesis but one has not been scheduled. Family reports confusion and swelling to bilateral feet x2 weeks. Patient is spanish speaking. Interpreter Ebelyn #[id number] was used to complete triage.

## 2019-05-28 ENCOUNTER — Emergency Department (HOSPITAL_COMMUNITY): Payer: Medicare Other

## 2019-05-28 LAB — COMPREHENSIVE METABOLIC PANEL
ALT: 60 U/L — ABNORMAL HIGH (ref 0–44)
AST: 103 U/L — ABNORMAL HIGH (ref 15–41)
Albumin: 3 g/dL — ABNORMAL LOW (ref 3.5–5.0)
Alkaline Phosphatase: 101 U/L (ref 38–126)
Anion gap: 12 (ref 5–15)
BUN: 10 mg/dL (ref 8–23)
CO2: 23 mmol/L (ref 22–32)
Calcium: 8.6 mg/dL — ABNORMAL LOW (ref 8.9–10.3)
Chloride: 102 mmol/L (ref 98–111)
Creatinine, Ser: 0.62 mg/dL (ref 0.44–1.00)
GFR calc Af Amer: >60 mL/min (ref >60)
GFR calc non Af Amer: >60 mL/min (ref >60)
Glucose, Bld: 132 mg/dL — ABNORMAL HIGH (ref 70–99)
Potassium: 3.7 mmol/L (ref 3.5–5.1)
Sodium: 137 mmol/L (ref 135–145)
Total Bilirubin: 1.1 mg/dL (ref 0.3–1.2)
Total Protein: 6 g/dL — ABNORMAL LOW (ref 6.5–8.1)

## 2019-05-28 LAB — CBC WITH DIFFERENTIAL/PLATELET
Abs Immature Granulocytes: 0.01 10*3/uL (ref 0.00–0.07)
Basophils Absolute: 0.1 10*3/uL (ref 0.0–0.1)
Basophils Relative: 1 %
Eosinophils Absolute: 0.3 10*3/uL (ref 0.0–0.5)
Eosinophils Relative: 6 %
HCT: 38.1 % (ref 36.0–46.0)
Hemoglobin: 12.6 g/dL (ref 12.0–15.0)
Immature Granulocytes: 0 %
Lymphocytes Relative: 37 %
Lymphs Abs: 1.7 10*3/uL (ref 0.7–4.0)
MCH: 30.9 pg (ref 26.0–34.0)
MCHC: 33.1 g/dL (ref 30.0–36.0)
MCV: 93.4 fL (ref 80.0–100.0)
Monocytes Absolute: 0.6 10*3/uL (ref 0.1–1.0)
Monocytes Relative: 13 %
Neutro Abs: 1.9 10*3/uL (ref 1.7–7.7)
Neutrophils Relative %: 43 %
Platelets: 103 10*3/uL — ABNORMAL LOW (ref 150–400)
RBC: 4.08 MIL/uL (ref 3.87–5.11)
RDW: 17 % — ABNORMAL HIGH (ref 11.5–15.5)
WBC: 4.5 10*3/uL (ref 4.0–10.5)
nRBC: 0 % (ref 0.0–0.2)

## 2019-05-28 LAB — BODY FLUID CELL COUNT WITH DIFFERENTIAL
Eos, Fluid: 0 %
Lymphs, Fluid: 83 %
Monocyte-Macrophage-Serous Fluid: 17 % — ABNORMAL LOW (ref 50–90)
Neutrophil Count, Fluid: 0 % (ref 0–25)
Total Nucleated Cell Count, Fluid: 516 cu mm (ref 0–1000)

## 2019-05-28 LAB — ALBUMIN, PLEURAL OR PERITONEAL FLUID: Albumin, Fluid: 1.3 g/dL

## 2019-05-28 LAB — PROTEIN, PLEURAL OR PERITONEAL FLUID: Total protein, fluid: <3 g/dL

## 2019-05-28 LAB — GLUCOSE, PLEURAL OR PERITONEAL FLUID: Glucose, Fluid: 99 mg/dL

## 2019-05-28 LAB — GRAM STAIN

## 2019-05-28 LAB — APTT: aPTT: 36 seconds (ref 24–36)

## 2019-05-28 LAB — LACTATE DEHYDROGENASE, PLEURAL OR PERITONEAL FLUID: LD, Fluid: 62 U/L — ABNORMAL HIGH (ref 3–23)

## 2019-05-28 LAB — PROTIME-INR
INR: 1.5 — ABNORMAL HIGH (ref 0.8–1.2)
Prothrombin Time: 17.6 seconds — ABNORMAL HIGH (ref 11.4–15.2)

## 2019-05-28 LAB — AMMONIA: Ammonia: 24 umol/L (ref 9–35)

## 2019-05-28 MED ORDER — SODIUM CHLORIDE 0.9 % IV BOLUS
500.0000 mL | Freq: Once | INTRAVENOUS | Status: AC
  Administered 2019-05-28: 05:00:00 500 mL via INTRAVENOUS

## 2019-05-28 MED ORDER — LIDOCAINE HCL 1 % IJ SOLN
INTRAMUSCULAR | Status: AC
  Filled 2019-05-28: qty 10

## 2019-05-28 MED ORDER — METOCLOPRAMIDE HCL 5 MG/ML IJ SOLN
10.0000 mg | Freq: Once | INTRAMUSCULAR | Status: AC
  Administered 2019-05-28: 10 mg via INTRAVENOUS
  Filled 2019-05-28: qty 2

## 2019-05-28 MED ORDER — ALBUMIN HUMAN 5 % IV SOLN
25.0000 g | Freq: Once | INTRAVENOUS | Status: AC
  Administered 2019-05-28: 25 g via INTRAVENOUS
  Filled 2019-05-28: qty 500

## 2019-05-28 MED ORDER — KETOROLAC TROMETHAMINE 15 MG/ML IJ SOLN
15.0000 mg | Freq: Once | INTRAMUSCULAR | Status: AC
  Administered 2019-05-28: 15 mg via INTRAVENOUS
  Filled 2019-05-28: qty 1

## 2019-05-28 NOTE — ED Notes (Signed)
Provider made aware of decrease in pt BP. Verbal order for 500 ml bolus given.

## 2019-05-28 NOTE — Discharge Instructions (Addendum)
°  Please continue to see your GI doctors for now.  Follow-up with [facility] if you would wish to switch to [facility] system.  Stay on a high protein diet, and avoid salt.  We have supplied some information on high-protein diet, which will help, since you have ascites.  This information given includes high-calorie diet but you should not be on extra calories, at this time.  Avoiding salt can help decrease swelling as well.

## 2019-05-28 NOTE — ED Provider Notes (Signed)
2:50 PM.-Patient seen by overnight team for abdominal pain with distention and suspected ascites causing her discomfort.  She was sent for interventional radiology drainage, which has been done and successfully achieved removal of 3 L of ascitic fluid.  Blood pressure has normalized at this time.  Screening labs by serum, nondiagnostic, but consistent with nonspecific liver disease.  INR slightly elevated.  Portable chest x-ray is normal.  Ascitic fluid, does not indicate infection.  At this time patient, patient is normotensive and comfortable.  Findings discussed with the patient and recommendations given.   Daleen Bo, MD 05/28/19 1504

## 2019-05-28 NOTE — ED Notes (Signed)
Patient being transported to Ultrasound for Abd. Paracentesis.

## 2019-05-28 NOTE — Procedures (Signed)
   US guided LLQ paracentesis  2.9 L dark yellow fluid obtained Sent for labs per MD  Tolerated well

## 2019-05-28 NOTE — ED Provider Notes (Addendum)
[facility]-EMERGENCY DEPT Provider Note   CSN: [id number] Arrival date & time: 05/27/19  2123     History   Chief Complaint Chief Complaint  Patient presents with  . Abdominal Pain    HPI Zoe Guzman is a 68 y.o. female.     HPI 68 year old female with history of diabetes, fibromyalgia, hypertension comes in a chief complaint of abdominal pain.  Patient's daughter is an IT trainer and at the request of the patient she translated for [place].  She declined the colon provided translator service.  Patient informs that she has been feeling sick for the last several weeks.  She has abdominal bloating, distention, initial weight loss followed by 10 pound weight gain over the past few days.  She is also having intermittent headaches, dizziness, shortness of breath and confusion.  Patient is seeing her PCP.  She had upper and lower endoscopy which were negative.  Subsequently they found her LFTs elevated and further work-up showed that patient has liver cirrhosis.  At the moment patient is pending liver biopsy and paracentesis.  Family brought her in however because patient is having worsening shortness of breath when she lays flat at night and she is unable to fall asleep.  Patient is also complaining of worsening abdominal discomfort, bloating and distention.  Past Medical History:  Diagnosis Date  . Diabetes mellitus without complication (HCC)   . Fibromyalgia   . Hypertension     Patient Active Problem List   Diagnosis Date Noted  . BACK PAIN, LUMBAR, CHRONIC 10/15/2008  . EYE PAIN, RIGHT 09/24/2008  . RAYNAUD'S SYNDROME 09/24/2008  . DRY MOUTH 09/24/2008  . SHOULDER PAIN, LEFT 09/24/2008  . KNEE PAIN, RIGHT 09/24/2008  . DIZZINESS 09/24/2008  . HEADACHE 09/24/2008  . 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 ESSENTIAL HYPERTENSION, BENIGN 03/19/2008  . HEARTBURN 03/19/2008  . SYSTEMIC LUPUS ERYTHEMATOSUS, HX OF 03/19/2008  . DIABETES MELLITUS, TYPE  II, CONTROLLED 09/21/1997    Past Surgical History:  Procedure Laterality Date  . APPENDECTOMY    . BACK SURGERY  2004   herniated disc  . BREAST REDUCTION SURGERY    . tummy tuck       OB History   No obstetric history on file.      Home Medications    Prior to Admission medications   Medication Sig Start Date End Date Taking? Authorizing Provider  acetaminophen (TYLENOL) 500 MG tablet Take 500-1,000 mg by mouth every 6 (six) hours as needed for moderate pain.   Yes [provider]  apixaban (ELIQUIS) 5 MG TABS tablet Take 5 mg by mouth 2 (two) times daily.   Yes [provider]  atorvastatin (LIPITOR) 40 MG tablet Take 40 mg by mouth daily.   Yes [provider]  Collagen-Vitamin C 740-125 MG CAPS Take 1 tablet by mouth daily.   Yes [provider]  digoxin (LANOXIN) 0.125 MG tablet Take 0.125 mg by mouth daily.   Yes [provider]  furosemide (LASIX) 20 MG tablet Take 20 mg by mouth daily.   Yes [provider]  Ginkgo Biloba 120 MG TABS Take 120 mg by mouth daily.   Yes [provider]  glipiZIDE (GLUCOTROL) 10 MG tablet Take 10 mg by mouth 2 (two) times daily before a meal.   Yes [provider]  linaclotide (LINZESS) 72 MCG capsule Take 72 mcg by mouth daily before breakfast.   Yes [provider]  metFORMIN (GLUCOPHAGE) 500 MG tablet Take  500 mg by mouth 2 (two) times daily with a meal.    Yes [provider]  metoprolol (LOPRESSOR) 50 MG tablet Take 100 mg by mouth 2 (two) times daily.    Yes [provider]  Multiple Vitamin (MULTIVITAMIN WITH MINERALS) TABS tablet Take 1 tablet by mouth daily.   Yes [provider]  omega-3 acid ethyl esters (LOVAZA) 1 g capsule Take 1 g by mouth daily.   Yes [provider]  omeprazole (PRILOSEC) 40 MG capsule Take 40 mg by mouth daily.   Yes [provider]  Propylene Glycol (SYSTANE BALANCE) 0.6 % SOLN Apply  1 drop to eye as needed (dry eyes).   Yes [provider]  sertraline (ZOLOFT) 25 MG tablet Take 25 mg by mouth daily.   Yes [provider]  spironolactone (ALDACTONE) 50 MG tablet Take 50 mg by mouth daily.   Yes [provider]  vitamin B-12 (CYANOCOBALAMIN) 1000 MCG tablet Take 1,000 mcg by mouth daily.    Yes [provider]  oxyCODONE-acetaminophen (PERCOCET/ROXICET) 5-325 MG per tablet Take 1-2 tablets by mouth every 6 (six) hours as needed for severe pain. Patient not taking: Reported on 05/28/2019 12/08/13   Renne CriglerGeiple, Joshua, PA-C    Family History No family history on file.  Social History Social History   Tobacco Use  . Smoking status: Never Smoker  Substance Use Topics  . Alcohol use: No  . Drug use: No     Allergies   Oxycodone-acetaminophen, Contrast media [iodinated diagnostic agents], Hydrocodone, Oxycodone, Tramadol, Gabapentin, and Lexapro [escitalopram oxalate]   Review of Systems Review of Systems  Constitutional: Positive for activity change.  Respiratory: Negative for shortness of breath.   Cardiovascular: Negative for chest pain.  Gastrointestinal: Positive for abdominal distention, abdominal pain and nausea.  Allergic/Immunologic: Negative for immunocompromised state.  Hematological: Does not bruise/bleed easily.  All other systems reviewed and are negative.    Physical Exam Updated Vital Signs BP 90/66   Pulse 82   Temp 98.3 F (36.8 C) (Oral)   Resp 18   SpO2 100%   Physical Exam Vitals signs and nursing note reviewed.  Constitutional:      Appearance: She is well-developed.  HENT:     Head: Normocephalic and atraumatic.  Neck:     Musculoskeletal: Normal range of motion and neck supple.  Cardiovascular:     Rate and Rhythm: Normal rate.  Pulmonary:     Effort: Pulmonary effort is normal.  Abdominal:     General: Bowel sounds are decreased.     Tenderness: There is generalized abdominal tenderness.  There is no guarding or rebound.  Skin:    General: Skin is warm and dry.  Neurological:     Mental Status: She is alert and oriented to person, place, and time.      ED Treatments / Results  Labs (all labs ordered are listed, but only abnormal results are displayed) Labs Reviewed  COMPREHENSIVE METABOLIC PANEL - Abnormal; Notable for the following components:      Result Value   Glucose, Bld 132 (*)    Calcium 8.6 (*)    Total Protein 6.0 (*)    Albumin 3.0 (*)    AST 103 (*)    ALT 60 (*)    All other components within normal limits  CBC WITH DIFFERENTIAL/PLATELET - Abnormal; Notable for the following components:   RDW 17.0 (*)    Platelets 103 (*)    All other components  within normal limits  PROTIME-INR - Abnormal; Notable for the following components:   Prothrombin Time 17.6 (*)    INR 1.5 (*)    All other components within normal limits  APTT  AMMONIA    EKG None  Radiology Dg Chest Port 1 View  Result Date: 05/28/2019 CLINICAL DATA:  Abdominal pain and bloating EXAM: PORTABLE CHEST 1 VIEW COMPARISON:  None. FINDINGS: The heart size and mediastinal contours are within normal limits. Both lungs are clear. Aortic knob calcifications are seen. IMPRESSION: No acute cardiopulmonary process. Electronically Signed   By: Jonna ClarkBindu  Avutu M.D.   On: 05/28/2019 01:03    Procedures Procedures (including critical care time)  Medications Ordered in ED Medications  albumin human 5 % solution 25 g (has no administration in time range)  metoCLOPramide (REGLAN) injection 10 mg (10 mg Intravenous Given 05/28/19 0216)  ketorolac (TORADOL) 15 MG/ML injection 15 mg (15 mg Intravenous Given 05/28/19 0215)  sodium chloride 0.9 % bolus 500 mL (0 mLs Intravenous Stopped 05/28/19 0551)     Initial Impression / Assessment and Plan / ED Course  I have reviewed the triage vital signs and the nursing notes.  Pertinent labs & imaging results that were available during my care of the patient  were reviewed by me and considered in my medical decision making (see chart for details).  Clinical Course as of May 27 552  Wynelle LinkSun May 28, 2019  0350 Headache has resolved.   [AN]  0350 IR paracentesis ordered, patient made aware.   [AN]  0550 Patient was up for discharge.  Unfortunately her BP was recorded in the low 90s at the time of discharge.  We give final 8 cc bolus and BP continues to be in the 90s over 60s range.  Patient is not symptomatic.  It is already almost 6 AM and patient is supposed to get a.m. paracentesis, therefore we will keep her in the ED, monitor closely and give her albumin.  Patient and wife made aware of this. They have been made aware that paracentesis could be canceled if patient's BP is too low.  Additionally, I question them about any infection-like symptoms.  Patient and daughter continue to state that patient does not have any new vomiting, fevers, chills, UTI-like symptoms, rash.  It is possible that the BP is low because it is early morning or part of the liver cirrhosis sequela.   [AN]    Clinical Course User Index [AN] Derwood KaplanNanavati, Zebastian Carico, MD       68 year old comes in with multiple complaints.  Primarily they are concerned that she is having worsening difficulty in breathing and abdominal distention with weight gain.  Chart review from outside institution reveals that patient likely has liver cirrhosis and she is undergoing work-up for it.  Labs here are overall reassuring.  She has a headache, without any red flags concerning for brain infection, brain bleed.  There is no meningismus or neck pain.  Patient does not need CT scan.  IV Reglan will be given.  The confusion could be because of hepatic encephalopathy.  Ammonia ordered.  However patient is lucid with me and given that she is not sleeping well, some of her memory issues could be secondary to loss of sleep.  Finally, the abdominal exam does not reveal any signs of peritonitis.  Patient does not  have jaundice.  I encouraged patient to continue following with the GI team.  We will order outpatient paracentesis as patient's symptoms could improve with  that procedure.  On the flip side, it could lead to diagnostic delay and patient has been made aware of that.  I will order cytology and other lab with the paracentesis here, however we could miss important markers that might be needed for diagnosis leading to repeat paracentesis.  Final Clinical Impressions(s) / ED Diagnoses   Final diagnoses:  Shortness of breath  Other ascites    ED Discharge Orders    None        Varney Biles, MD 05/28/19 [phone]

## 2019-05-30 LAB — PH, BODY FLUID: pH, Body Fluid: 7.3

## 2019-06-02 LAB — CULTURE, BODY FLUID W GRAM STAIN -BOTTLE: Culture: NO GROWTH

## 2019-11-17 ENCOUNTER — Ambulatory Visit: Payer: Medicare Other | Attending: Internal Medicine

## 2019-11-17 DIAGNOSIS — Z23 Encounter for immunization: Secondary | ICD-10-CM | POA: Insufficient documentation

## 2019-11-17 NOTE — Progress Notes (Signed)
   Covid-19 Vaccination Clinic  Name:  Zoe Guzman    MRN: [id number] DOB: 07-Jul-1951  11/17/2019  [name] was observed post Covid-19 immunization for 15 minutes without incidence. She was provided with Vaccine Information Sheet and instruction to access the V-Safe system.   [name] was instructed to call 911 with any severe reactions post vaccine: Marland Kitchen Difficulty breathing  . Swelling of your face and throat  . A fast heartbeat  . A bad rash all over your body  . Dizziness and weakness    Immunizations Administered    Name Date Dose VIS Date Route   Pfizer COVID-19 Vaccine 11/17/2019  1:58 PM 0.3 mL 09/01/2019 Intramuscular   Manufacturer: [employer], [employer]   Lot: [id number]   NDC: [address]-5

## 2019-12-13 ENCOUNTER — Ambulatory Visit: Payer: Medicare Other | Attending: Internal Medicine

## 2019-12-13 DIAGNOSIS — Z23 Encounter for immunization: Secondary | ICD-10-CM

## 2019-12-13 NOTE — Progress Notes (Signed)
   Covid-19 Vaccination Clinic  Name:  Zoe Guzman    MRN: [id number] DOB: Nov 01, 1950  12/13/2019  [name] was observed post Covid-19 immunization for 15 minutes without incident. She was provided with Vaccine Information Sheet and instruction to access the V-Safe system.   [name] was instructed to call 911 with any severe reactions post vaccine: Marland Kitchen Difficulty breathing  . Swelling of face and throat  . A fast heartbeat  . A bad rash all over body  . Dizziness and weakness   Immunizations Administered    Name Date Dose VIS Date Route   Pfizer COVID-19 Vaccine 12/13/2019  3:08 PM 0.3 mL 09/01/2019 Intramuscular   Manufacturer: [employer], [employer]   Lot: [id number]   NDC: [address]-4

## 2021-03-05 IMAGING — DX DG CHEST 1V PORT
1 series · 1 of 1 positions shown · non-contrast
Comparison: None.

CLINICAL DATA: Abdominal pain and bloating

EXAM:
PORTABLE CHEST 1 VIEW

[chest ap]
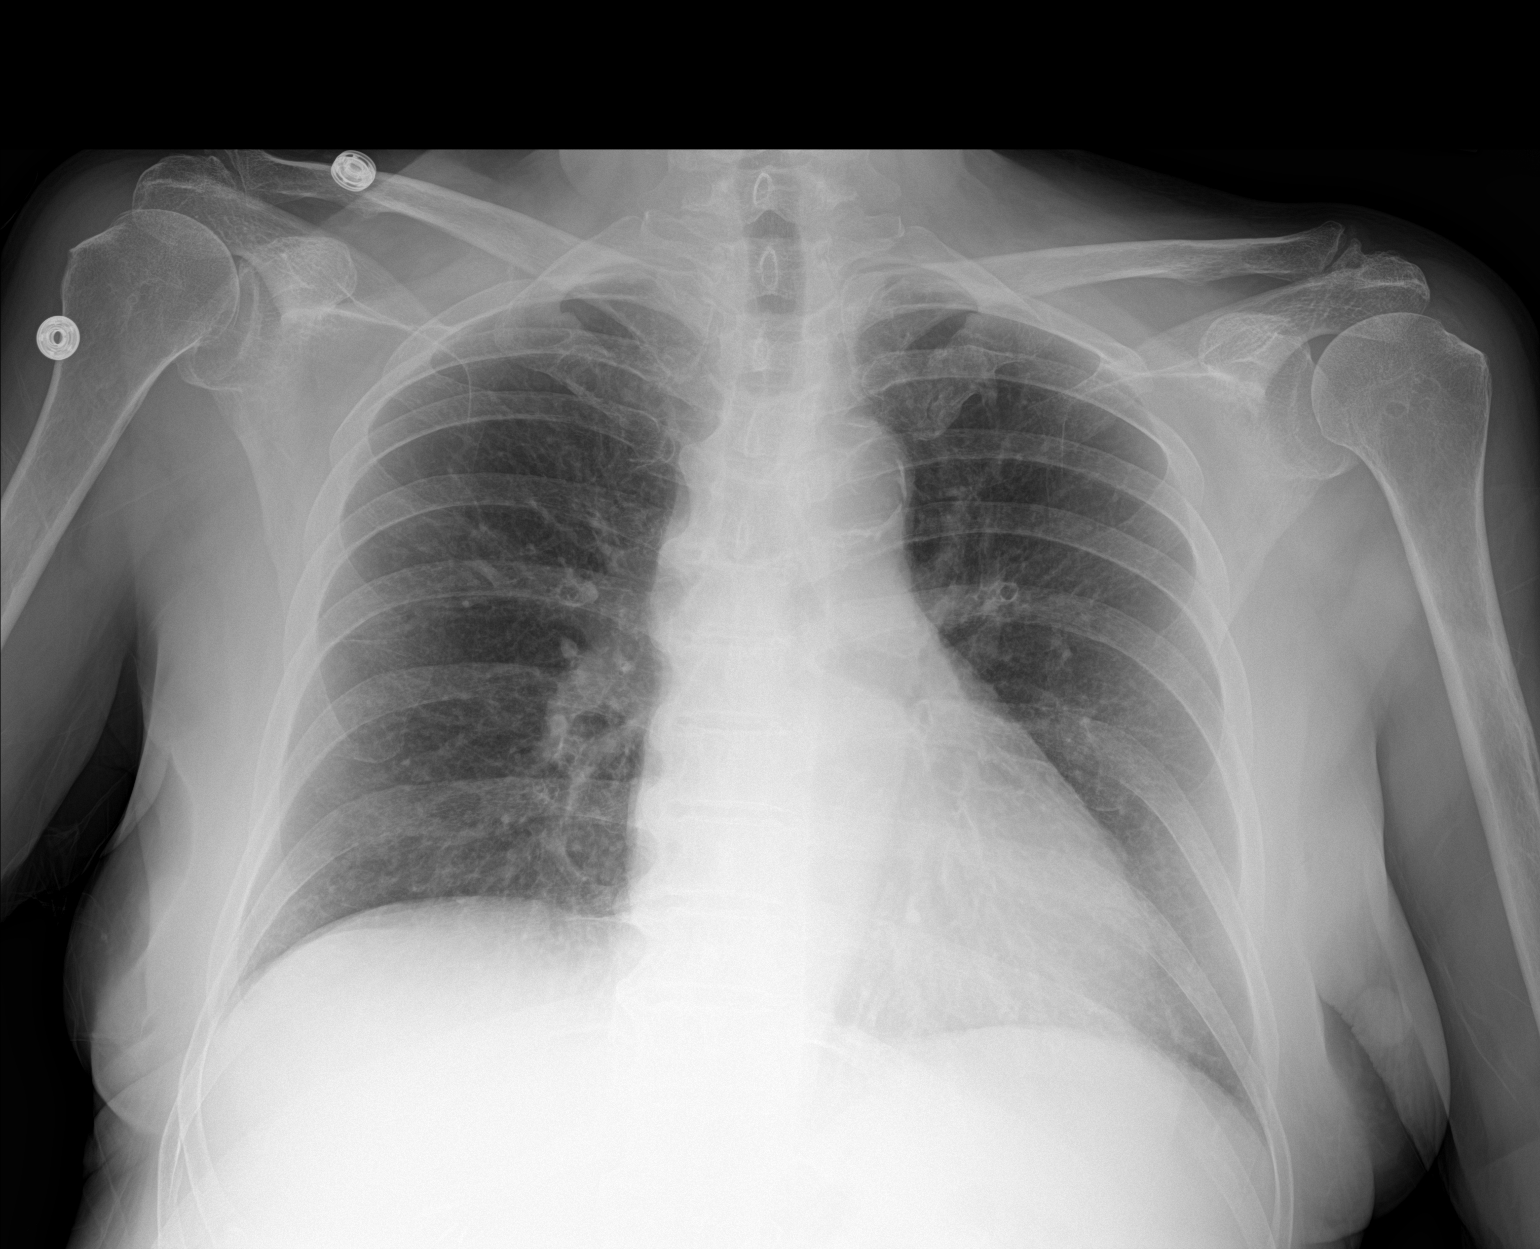

[1 of 1 positions shown; findings below may reference images not displayed]

FINDINGS: The heart size and mediastinal contours are within normal limits.
Both lungs are clear. Aortic knob calcifications are seen.
IMPRESSION: No acute cardiopulmonary process.

## 2021-03-05 IMAGING — US US PARACENTESIS
1 series · 5 of 5 positions shown · non-contrast
Comparison: None.

MEDICATIONS:
10 cc 1% lidocaine

COMPLICATIONS:
None immediate.

INDICATION: Ascites; abd pain

EXAM:
ULTRASOUND-GUIDED PARACENTESIS
TECHNIQUE: Informed written consent was obtained from the patient after a
discussion of the risks, benefits and alternatives to treatment. A
timeout was performed prior to the initiation of the procedure.

[Series 1: us paracentesis · 5 of 5 slices shown]
[im 1/5]
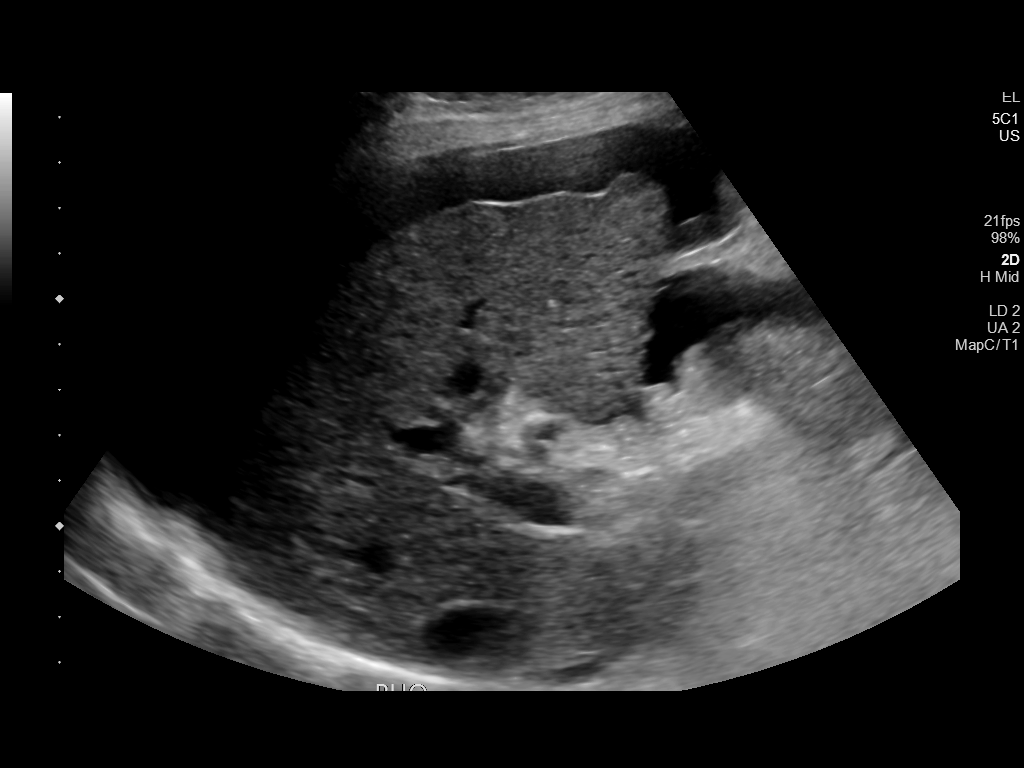
[im 2/5]
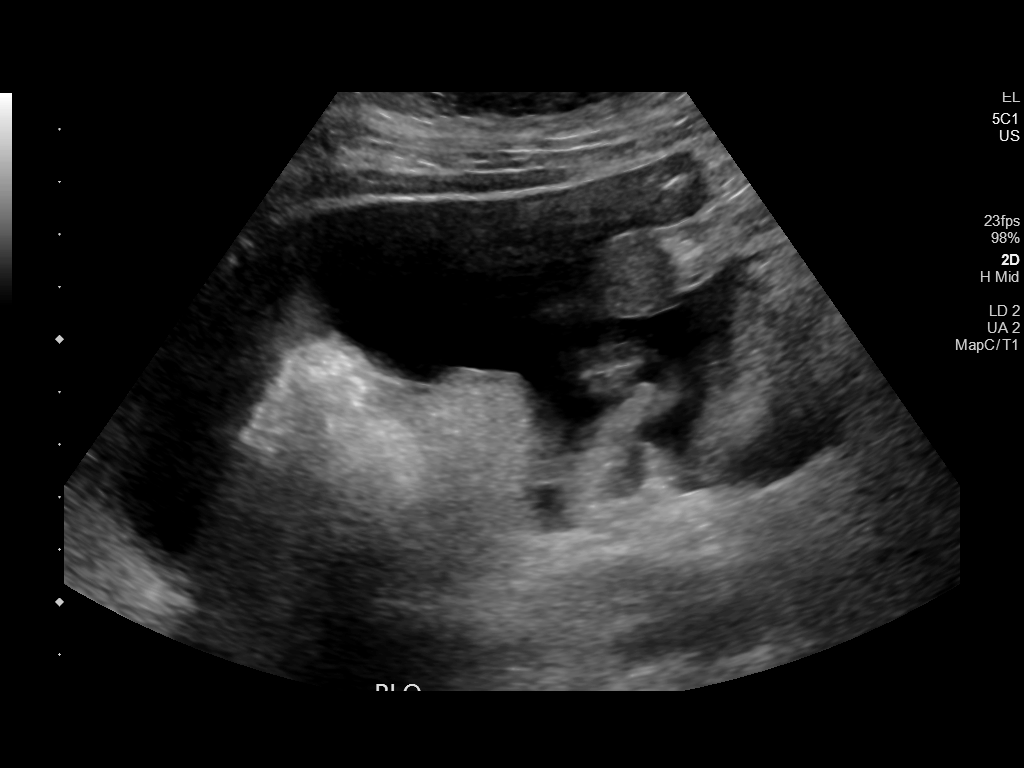
[im 3/5]
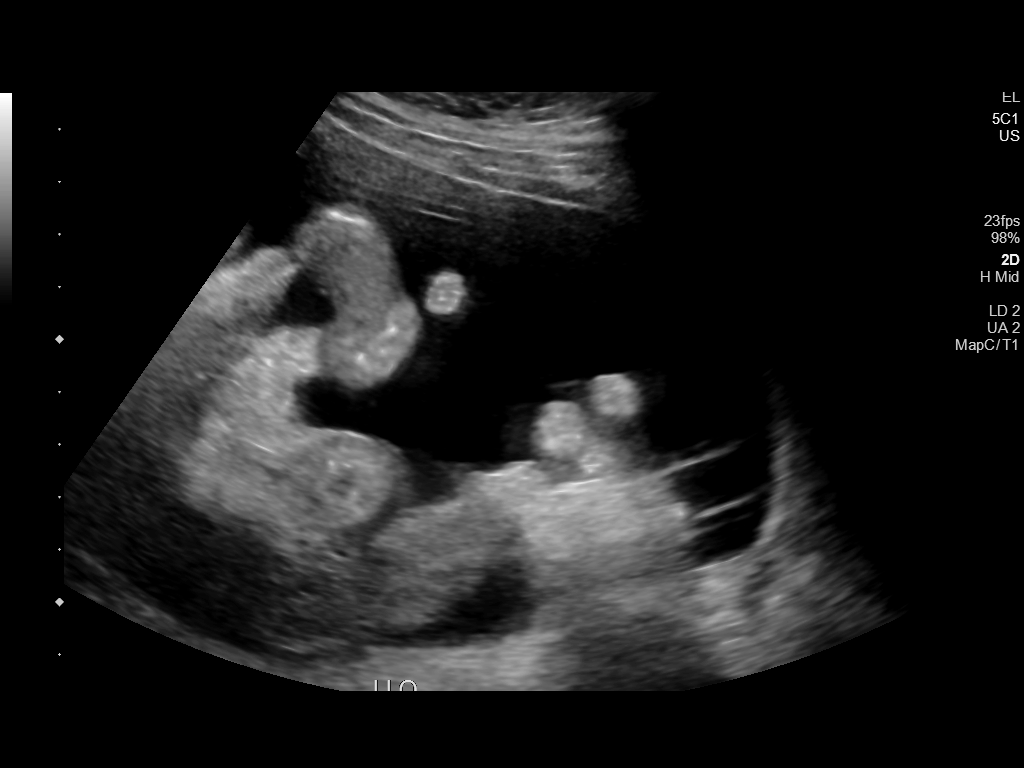
[im 4/5]
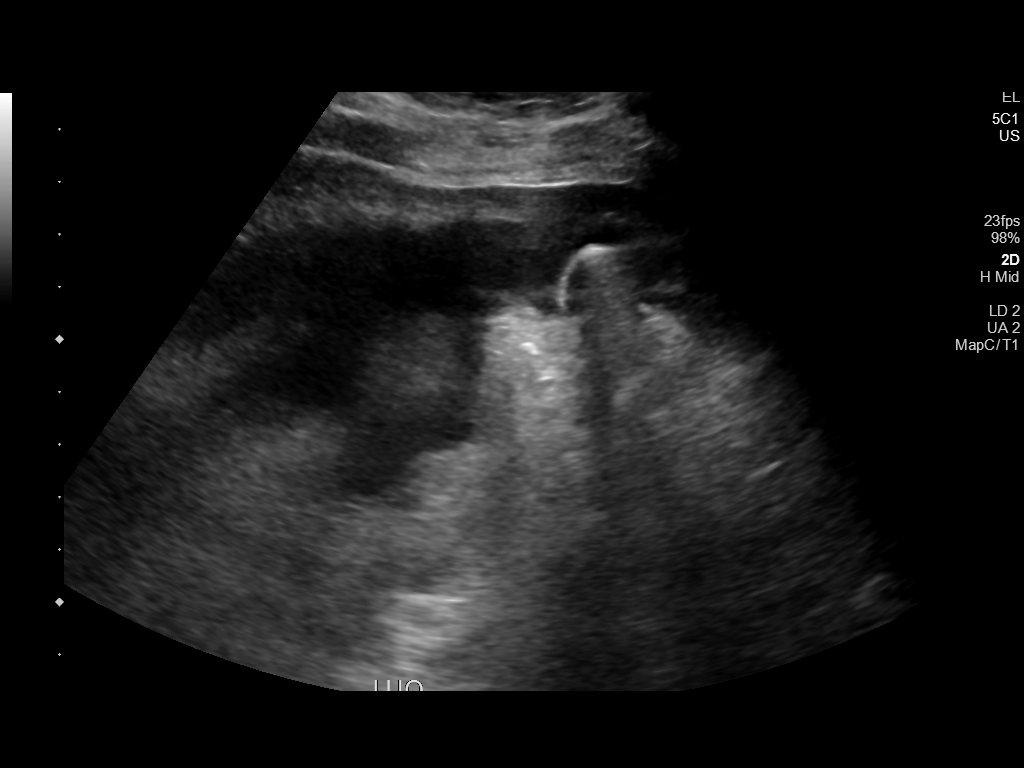
[im 5/5]
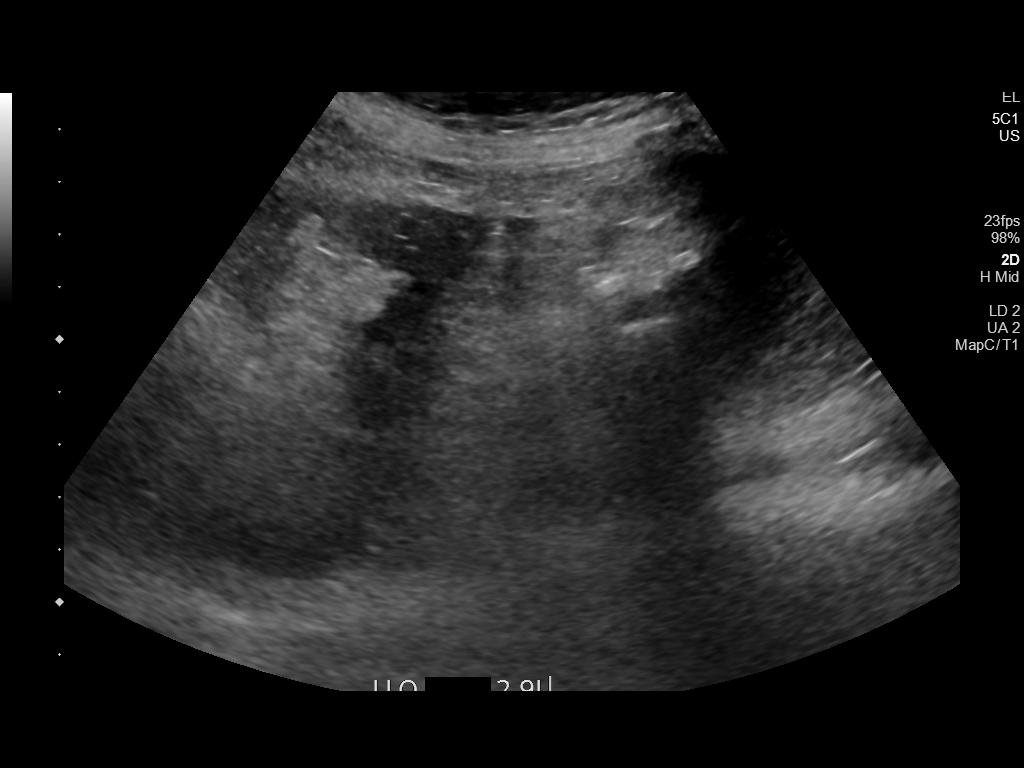

[5 of 5 positions shown; findings below may reference images not displayed]

Initial ultrasound scanning demonstrates a large amount of ascites
within the left lower abdominal quadrant. The left lower abdomen was
prepped and draped in the usual sterile fashion. 1% lidocaine with
epinephrine was used for local anesthesia. Under direct ultrasound
guidance, a 19 gauge, 7-cm, Yueh catheter was introduced. An
ultrasound image was saved for documentation purposed. The
paracentesis was performed. The catheter was removed and a dressing
was applied. The patient tolerated the procedure well without
immediate post procedural complication.
FINDINGS: A total of approximately 2.9 liters of dark yellow fluid was
removed. Samples were sent to the laboratory as requested by the
clinical team.
IMPRESSION: Successful ultrasound-guided paracentesis yielding 2.9 liters of
peritoneal fluid.

Read by

Nazareth Jumper

## 2022-10-06 ENCOUNTER — Other Ambulatory Visit: Payer: Self-pay

## 2022-10-06 ENCOUNTER — Encounter (HOSPITAL_COMMUNITY): Payer: Self-pay | Admitting: Emergency Medicine

## 2022-10-06 ENCOUNTER — Emergency Department (HOSPITAL_COMMUNITY)
Admission: EM | Admit: 2022-10-06 | Discharge: 2022-10-07 | Disposition: A | Discharge status: Home / Self Care | Payer: 59 | Attending: Emergency Medicine | Admitting: Emergency Medicine

## 2022-10-06 ENCOUNTER — Emergency Department (HOSPITAL_COMMUNITY): Payer: 59

## 2022-10-06 DIAGNOSIS — R1084 Generalized abdominal pain: Secondary | ICD-10-CM | POA: Insufficient documentation

## 2022-10-06 DIAGNOSIS — R109 Unspecified abdominal pain: Secondary | ICD-10-CM | POA: Diagnosis present

## 2022-10-06 DIAGNOSIS — K659 Peritonitis, unspecified: Secondary | ICD-10-CM | POA: Insufficient documentation

## 2022-10-06 LAB — COMPREHENSIVE METABOLIC PANEL
ALT: 29 U/L (ref 0–44)
AST: 47 U/L — ABNORMAL HIGH (ref 15–41)
Albumin: 4.2 g/dL (ref 3.5–5.0)
Alkaline Phosphatase: 159 U/L — ABNORMAL HIGH (ref 38–126)
Anion gap: 12 (ref 5–15)
BUN: 10 mg/dL (ref 8–23)
CO2: 21 mmol/L — ABNORMAL LOW (ref 22–32)
Calcium: 9 mg/dL (ref 8.9–10.3)
Chloride: 99 mmol/L (ref 98–111)
Creatinine, Ser: 0.77 mg/dL (ref 0.44–1.00)
GFR, Estimated: >60 mL/min (ref >60)
Glucose, Bld: 129 mg/dL — ABNORMAL HIGH (ref 70–99)
Potassium: 4 mmol/L (ref 3.5–5.1)
Sodium: 132 mmol/L — ABNORMAL LOW (ref 135–145)
Total Bilirubin: 0.7 mg/dL (ref 0.3–1.2)
Total Protein: 7.3 g/dL (ref 6.5–8.1)

## 2022-10-06 LAB — URINALYSIS, ROUTINE W REFLEX MICROSCOPIC
Bilirubin Urine: NEGATIVE
Glucose, UA: >=500 mg/dL — AB
Hgb urine dipstick: NEGATIVE
Ketones, ur: NEGATIVE mg/dL
Nitrite: NEGATIVE
Protein, ur: NEGATIVE mg/dL
Specific Gravity, Urine: 1.035 — ABNORMAL HIGH (ref 1.005–1.030)
pH: 5 (ref 5.0–8.0)

## 2022-10-06 LAB — CBC
HCT: 46.9 % — ABNORMAL HIGH (ref 36.0–46.0)
Hemoglobin: 15.1 g/dL — ABNORMAL HIGH (ref 12.0–15.0)
MCH: 28.4 pg (ref 26.0–34.0)
MCHC: 32.2 g/dL (ref 30.0–36.0)
MCV: 88.2 fL (ref 80.0–100.0)
Platelets: 114 10*3/uL — ABNORMAL LOW (ref 150–400)
RBC: 5.32 MIL/uL — ABNORMAL HIGH (ref 3.87–5.11)
RDW: 16.5 % — ABNORMAL HIGH (ref 11.5–15.5)
WBC: 3.5 10*3/uL — ABNORMAL LOW (ref 4.0–10.5)
nRBC: 0 % (ref 0.0–0.2)

## 2022-10-06 LAB — LIPASE, BLOOD: Lipase: 50 U/L (ref 11–51)

## 2022-10-06 NOTE — ED Triage Notes (Signed)
Pt c/o of center and left sided abdominal pain. Pt had paracentesis done yesterday with approx 4L drained and incision site has been leaking fluid since discharge. Pt states there was concern for peritonitis during hospital stay but they received call today said she did not have peritonitis.

## 2022-10-07 ENCOUNTER — Emergency Department (HOSPITAL_COMMUNITY): Payer: 59

## 2022-10-07 DIAGNOSIS — R1084 Generalized abdominal pain: Secondary | ICD-10-CM | POA: Diagnosis not present

## 2022-10-07 NOTE — Discharge Instructions (Addendum)
As discussed, with ongoing challenges of cirrhosis and peritoneal fluid leakage is important to follow-up with a gastroenterologist.  Please use the provided information above to discuss transition of care to a local GI team.  In the interim, please consider following up with your existing gastroenterology team, including notifying them of today's ED visit.  Return here for concerning changes in your condition.

## 2022-10-07 NOTE — ED Provider Notes (Signed)
[facility] EMERGENCY DEPARTMENT Provider Note   CSN: [id number] Arrival date & time: 10/06/22  2101     History  Chief Complaint  Patient presents with   Abdominal Pain    Zoe Guzman is a 72 y.o. female.  HPI Patient presents with her daughter in law who assists with the history.  The patient speaks [place] and [place], daughter-in-law is a Nurse, learning disability at our facility.  In essence the patient has multiple medical issues, most notably cirrhosis, dementia.  She receives paracentesis with some frequency every few weeks.  She was discharged from a local facility yesterday after being admitted 2 days ago with abdominal pain, having a paracentesis at that point, and being observed for possible retinitis.  She went to the facility with abdominal pain but also with concern for fall that occurred prior.  Reportedly during that hospitalization patient had a paracentesis performed, x-rays, imaging.  On picking up the patient at discharge family noted substantial amount of serous fluid in her clothing, on the floor, and with concern for ongoing leakage, possible infection, ongoing pain in her abdomen and left shoulder she is brought here for evaluation.    Home Medications Prior to Admission medications   Medication Sig Start Date End Date Taking? Authorizing Provider  acetaminophen (TYLENOL) 500 MG tablet Take 500-1,000 mg by mouth every 6 (six) hours as needed for moderate pain.    [provider]  apixaban (ELIQUIS) 5 MG TABS tablet Take 5 mg by mouth 2 (two) times daily.    [provider]  atorvastatin (LIPITOR) 40 MG tablet Take 40 mg by mouth daily.    [provider]  Collagen-Vitamin C 740-125 MG CAPS Take 1 tablet by mouth daily.    [provider]  digoxin (LANOXIN) 0.125 MG tablet Take 0.125 mg by mouth daily.    [provider]  furosemide (LASIX) 20 MG tablet Take 20 mg by mouth daily.    [provider]   Ginkgo Biloba 120 MG TABS Take 120 mg by mouth daily.    [provider]  glipiZIDE (GLUCOTROL) 10 MG tablet Take 10 mg by mouth 2 (two) times daily before a meal.    [provider]  linaclotide (LINZESS) 72 MCG capsule Take 72 mcg by mouth daily before breakfast.    [provider]  metFORMIN (GLUCOPHAGE) 500 MG tablet Take 500 mg by mouth 2 (two) times daily with a meal.     [provider]  metoprolol (LOPRESSOR) 50 MG tablet Take 100 mg by mouth 2 (two) times daily.     [provider]  Multiple Vitamin (MULTIVITAMIN WITH MINERALS) TABS tablet Take 1 tablet by mouth daily.    [provider]  omega-3 acid ethyl esters (LOVAZA) 1 g capsule Take 1 g by mouth daily.    [provider]  omeprazole (PRILOSEC) 40 MG capsule Take 40 mg by mouth daily.    [provider]  oxyCODONE-acetaminophen (PERCOCET/ROXICET) 5-325 MG per tablet Take 1-2 tablets by mouth every 6 (six) hours as needed for severe pain. Patient not taking: Reported on 05/28/2019 12/08/13   Renne Crigler, PA-C  Propylene Glycol (SYSTANE BALANCE) 0.6 % SOLN Apply 1 drop to eye as needed (dry eyes).    [provider]  sertraline (ZOLOFT) 25 MG tablet Take 25 mg by mouth daily.    [provider]  spironolactone (ALDACTONE) 50 MG tablet Take 50 mg by mouth daily.    [provider]  vitamin B-12 (CYANOCOBALAMIN) 1000 MCG tablet Take 1,000 mcg by mouth daily.     [provider]      Allergies    Oxycodone-acetaminophen, Contrast media [iodinated contrast media], Hydrocodone, Oxycodone, Tramadol, Gabapentin, and Lexapro [escitalopram oxalate]    Review of Systems   Review of Systems  All other systems reviewed and are negative.   Physical Exam Updated Vital Signs BP 118/79   Pulse 89   Temp 98.1 F (36.7 C)   Resp 15   Ht 5\' 5"  (1.651 m)   Wt 61.2 kg   SpO2 99%   BMI 22.47 kg/m  Physical Exam Vitals and nursing  note reviewed.  Constitutional:      General: She is not in acute distress.    Appearance: She is ill-appearing. She is not toxic-appearing.  HENT:     Head: Normocephalic and atraumatic.  Eyes:     Conjunctiva/sclera: Conjunctivae normal.  Neck:   Cardiovascular:     Rate and Rhythm: Normal rate and regular rhythm.  Pulmonary:     Effort: Pulmonary effort is normal. No respiratory distress.     Breath sounds: Normal breath sounds. No stridor.  Abdominal:     General: There is no distension.    Musculoskeletal:       Arms:  Skin:    General: Skin is warm and dry.  Neurological:     Mental Status: She is alert and oriented to person, place, and time.     Cranial Nerves: No cranial nerve deficit.  Psychiatric:        Mood and Affect: Mood normal.     ED Results / Procedures / Treatments   Labs (all labs ordered are listed, but only abnormal results are displayed) Labs Reviewed  COMPREHENSIVE METABOLIC PANEL - Abnormal; Notable for the following components:      Result Value   Sodium 132 (*)    CO2 21 (*)    Glucose, Bld 129 (*)    AST 47 (*)    Alkaline Phosphatase 159 (*)    All other components within normal limits  CBC - Abnormal; Notable for the following components:   WBC 3.5 (*)    RBC 5.32 (*)    Hemoglobin 15.1 (*)    HCT 46.9 (*)    RDW 16.5 (*)    Platelets 114 (*)    All other components within normal limits  URINALYSIS, ROUTINE W REFLEX MICROSCOPIC - Abnormal; Notable for the following components:   APPearance HAZY (*)    Specific Gravity, Urine 1.035 (*)    Glucose, UA >=500 (*)    Leukocytes,Ua TRACE (*)    Bacteria, UA RARE (*)    Non Squamous Epithelial 0-5 (*)    All other components within normal limits  LIPASE, BLOOD    EKG None  Radiology DG Shoulder Left  Result Date: 10/07/2022 CLINICAL DATA:  Status post fall.  Pain in left shoulder. EXAM: LEFT SHOULDER - 2+ VIEW COMPARISON:  None Available. FINDINGS: There is no evidence of  fracture or dislocation. Degenerative changes are identified at the [facility] joint. Soft tissues are unremarkable. IMPRESSION: 1. No acute findings. 2. AC joint osteoarthritis. Electronically Signed   By: [facility] M.D.   On: 10/07/2022 11:17   CT ABDOMEN PELVIS WO CONTRAST  Result Date: 10/06/2022 CLINICAL DATA:  Central and left-sided abdominal pain. Recent paracentesis with fluid leaking from incision site. EXAM: CT ABDOMEN AND PELVIS WITHOUT CONTRAST TECHNIQUE: Multidetector CT imaging of  the abdomen and pelvis was performed following the standard protocol without IV contrast. RADIATION DOSE REDUCTION: This exam was performed according to the departmental dose-optimization program which includes automated exposure control, adjustment of the mA and/or kV according to patient size and/or use of iterative reconstruction technique. COMPARISON:  None Available. FINDINGS: Lower chest: The heart is normal in size and three-vessel coronary artery calcifications are noted. Mild atelectasis is noted in the medial aspect of the right middle lobe. Hepatobiliary: The liver has a nodular contour, compatible with underlying cirrhosis. No focal abnormality. The gallbladder is surgically absent. No biliary ductal dilatation. Pancreas: Unremarkable. No pancreatic ductal dilatation or surrounding inflammatory changes. Spleen: Normal in size without focal abnormality. Adrenals/Urinary Tract: The adrenal glands are within normal limits. No renal calculus or hydronephrosis. The bladder is decompressed. Stomach/Bowel: Stomach is within normal limits. Appendix is not seen. No evidence of bowel wall thickening, distention, or inflammatory changes. No free air or pneumatosis. Vascular/Lymphatic: Aortic atherosclerosis. No enlarged abdominal or pelvic lymph nodes. Reproductive: Uterus and bilateral adnexa are unremarkable. Other: There is mesenteric edema with mild-to-moderate residual ascites. Subcutaneous fat stranding and edema are  noted in the anterior, lateral, and posterior left abdomen and pelvis. No hematoma is seen. Musculoskeletal: Degenerative changes are present in the thoracolumbar spine and bilateral hips. No acute osseous abnormality. IMPRESSION: 1. Subcutaneous fat stranding and edema in the anterior, lateral, and posterior abdominal and pelvic wall on the left, possible edema. Correlate clinically to exclude superimposed infection. No hematoma is identified. 2. Morphologic findings of cirrhosis. 3. Mesenteric edema and moderate ascites. 4. Coronary artery calcifications and aortic atherosclerosis. Electronically Signed   By: Brett Fairy M.D.   On: 10/06/2022 23:04    Procedures Procedures    Medications Ordered in ED Medications - No data to display  ED Course/ Medical Decision Making/ A&P This patient with a Hx of dementia, cirrhosis, fall, recent hospitalization presents to the ED for concern of nominal pain, shoulder pain, this involves an extensive number of treatment options, and is a complaint that carries with it a high risk of complications and morbidity.    The differential diagnosis includes peritonitis, abdominal pain from cirrhosis, ascites, posttraumatic findings   Social Determinants of Health:  Advanced age, dementia  Additional history obtained:  Additional history and/or information obtained from chart review outside hospital ED evaluation yesterday with hospital course included below:  Hospital Course:   72 year old female with history of cirrhosis, recurrent ascites, persistent A-fib, diabetes hypertension, recent hospitalization for ascites/SBP treated with Rocephin and Omnicef who is also being considered for TIPS procedure for recurrent ascites. Patient apparently had a fall when she was out of state visiting her son about 2 weeks ago, has been complaining of left shoulder pain but did not seek medical advice. She came back to the town a couple days prior to admission, was brought  to the ER for symptoms of abdominal distention. Workup in the ER showed that patient was afebrile, relatively stable hemodynamically. She was found to have ascites, paracentesis was performed in the ER. CT abdomen and pelvis done in the ER showed large volume ascites, cirrhosis, splenomegaly. CT head negative for anything acute.  X-rays of the left shoulder with equivocal findings of minimal greater tuberosity impaction fracture.  #1. Alcoholic cirrhosis with recurrent ascites/recent SBP. Patient had paracentesis performed fluid analysis does not seem to suggest any definitive evidence of SBP at this time. She was observed overnight, has remained relatively stable. She has remained afebrile. Denies any  significant abdominal pain. Fluid cultures so far with no growth but is still in process.  Seen by GI in consultation, clinical suspicion for SBP appears low. She was empirically on Rocephin that GI recommends discontinuing without any further need of antibiotics.  Continue patient on Aldactone and Lasix, continue with outpatient follow-up with GI.  #2. Questionable left humeral greater tuberosity impaction fracture. Will refer to Ortho as outpatient for follow-up. Wear arm sling in the meantime.  #3. Persistent A-fib. Appears to be controlled. Continue digoxin, Eliquis. Consider stopping metoprolol due to recent SBP, however defer to GI and cardiology  Recommendations to physicians/followup needed: Pcp, GI  Physical Exam: Vitals:  10/06/22 1533  BP: 123/85  Pulse: 79  Resp: 18  Temp: 98.1 F (36.7 C)  SpO2: 100%    After the initial evaluation, orders, including: Chart review as above, x-ray of the shoulder given inconclusive findings from other hospital, labs were initiated.   Patient placed on Cardiac and Pulse-Oximetry Monitors. The patient was maintained on a cardiac monitor.  The cardiac monitored showed an rhythm of 85 sinus normal The patient was also maintained on pulse  oximetry. The readings were typically 100% room air normal   On repeat evaluation of the patient improved 1:23 PM Patient in no distress.  Lab Tests:  I personally interpreted labs.  The pertinent results include: Generally unremarkable, consistent with known hepatic dysfunction  Imaging Studies ordered:  I independently visualized and interpreted imaging which showed CT with no intraperitoneal substantial abnormalities, evidence for cirrhosis.  However, the patient is found to have likely fluid collecting subcutaneous.  I discussed this with the radiologist telephone as well reviewed the images. I agree with the radiologist interpretation  Consultations Obtained:  I requested consultation with the radiology as above,  and gastroenterology to try to facilitate follow-up  Dispostion / Final MDM:  After consideration of the diagnostic results and the patient's response to treatment, this adult female with multiple medical issues most notably dementia and hepatic dysfunction now presents with concern for fluid leakage, subcutaneous changes following recent admission for paracentesis and fall at another facility.  Care the patient is awake, alert, in no distress, she is hemodynamically unremarkable, and with report of no peritonitis at her facility, labs that are generally reassuring here, no negation for a new paracentesis today.  Indeed the patient has some continued seepage of peritoneal fluid, including subcutaneously and patient required application of pressure dressing provided by me.  Hospitalization consideration given her history as above, but given the reassuring findings, patient discharged with close outpatient follow-up.  Final Clinical Impression(s) / ED Diagnoses Final diagnoses:  Generalized abdominal pain  Peritoneal irritation ([place])     Carmin Muskrat, MD 10/07/22 1323

## 2022-10-07 NOTE — ED Notes (Signed)
Patient Alert and oriented to baseline. Stable and ambulatory to baseline. Patient verbalized understanding of the discharge instructions.  Patient belongings were taken by the patient.
